# Patient Record
Sex: Female | Born: 1980
Health system: Southern US, Community
[De-identification: ages and names within clinical notes are randomized; demographics above are authoritative.]

## PROBLEM LIST (undated history)

## (undated) HISTORY — PX: APPENDECTOMY: SHX54

## (undated) HISTORY — PX: TUBAL LIGATION: SHX77

## (undated) SURGERY — Surgical Case
Anesthesia: *Unknown

---

## 2005-03-20 ENCOUNTER — Ambulatory Visit: Payer: Self-pay | Admitting: *Deleted

## 2005-04-03 ENCOUNTER — Ambulatory Visit (HOSPITAL_COMMUNITY): Admission: RE | Admit: 2005-04-03 | Discharge: 2005-04-03 | Payer: Self-pay | Admitting: *Deleted

## 2005-04-03 ENCOUNTER — Ambulatory Visit: Payer: Self-pay | Admitting: *Deleted

## 2005-04-10 ENCOUNTER — Ambulatory Visit: Payer: Self-pay | Admitting: *Deleted

## 2005-04-17 ENCOUNTER — Ambulatory Visit: Payer: Self-pay | Admitting: *Deleted

## 2005-04-24 ENCOUNTER — Ambulatory Visit: Payer: Self-pay | Admitting: *Deleted

## 2005-05-01 ENCOUNTER — Ambulatory Visit: Payer: Self-pay | Admitting: *Deleted

## 2005-05-08 ENCOUNTER — Ambulatory Visit: Payer: Self-pay | Admitting: *Deleted

## 2005-05-09 ENCOUNTER — Ambulatory Visit: Payer: Self-pay | Admitting: Obstetrics and Gynecology

## 2005-05-09 ENCOUNTER — Inpatient Hospital Stay (HOSPITAL_COMMUNITY): Admission: AD | Admit: 2005-05-09 | Discharge: 2005-05-11 | Payer: Self-pay | Admitting: *Deleted

## 2006-09-16 ENCOUNTER — Emergency Department (HOSPITAL_COMMUNITY): Admission: EM | Admit: 2006-09-16 | Discharge: 2006-09-17 | Payer: Self-pay | Admitting: Emergency Medicine

## 2008-12-16 ENCOUNTER — Ambulatory Visit (HOSPITAL_COMMUNITY): Admission: RE | Admit: 2008-12-16 | Discharge: 2008-12-16 | Payer: Self-pay | Admitting: Family Medicine

## 2008-12-29 ENCOUNTER — Inpatient Hospital Stay (HOSPITAL_COMMUNITY): Admission: AD | Admit: 2008-12-29 | Discharge: 2008-12-29 | Payer: Self-pay | Admitting: Obstetrics & Gynecology

## 2008-12-29 ENCOUNTER — Inpatient Hospital Stay (HOSPITAL_COMMUNITY): Admission: AD | Admit: 2008-12-29 | Discharge: 2008-12-29 | Payer: Self-pay | Admitting: Family Medicine

## 2009-01-14 ENCOUNTER — Ambulatory Visit (HOSPITAL_COMMUNITY): Admission: RE | Admit: 2009-01-14 | Discharge: 2009-01-14 | Payer: Self-pay | Admitting: Obstetrics & Gynecology

## 2009-04-15 ENCOUNTER — Ambulatory Visit: Payer: Self-pay | Admitting: Obstetrics and Gynecology

## 2009-04-15 ENCOUNTER — Inpatient Hospital Stay (HOSPITAL_COMMUNITY): Admission: AD | Admit: 2009-04-15 | Discharge: 2009-04-15 | Payer: Self-pay | Admitting: Obstetrics & Gynecology

## 2009-05-03 ENCOUNTER — Inpatient Hospital Stay (HOSPITAL_COMMUNITY): Admission: AD | Admit: 2009-05-03 | Discharge: 2009-05-03 | Payer: Self-pay | Admitting: Obstetrics & Gynecology

## 2009-05-03 ENCOUNTER — Ambulatory Visit: Payer: Self-pay | Admitting: Advanced Practice Midwife

## 2009-05-23 ENCOUNTER — Inpatient Hospital Stay (HOSPITAL_COMMUNITY): Admission: AD | Admit: 2009-05-23 | Discharge: 2009-05-23 | Payer: Self-pay | Admitting: Obstetrics & Gynecology

## 2009-05-27 ENCOUNTER — Ambulatory Visit: Payer: Self-pay | Admitting: Family Medicine

## 2009-05-27 ENCOUNTER — Inpatient Hospital Stay (HOSPITAL_COMMUNITY): Admission: AD | Admit: 2009-05-27 | Discharge: 2009-05-29 | Payer: Self-pay | Admitting: Obstetrics & Gynecology

## 2009-07-21 ENCOUNTER — Emergency Department (HOSPITAL_COMMUNITY): Admission: EM | Admit: 2009-07-21 | Discharge: 2009-07-22 | Payer: Self-pay | Admitting: Emergency Medicine

## 2011-03-10 LAB — GC/CHLAMYDIA PROBE AMP, GENITAL
Chlamydia, DNA Probe: NEGATIVE
GC Probe Amp, Genital: NEGATIVE

## 2011-03-10 LAB — URINALYSIS, ROUTINE W REFLEX MICROSCOPIC
Glucose, UA: NEGATIVE mg/dL
Ketones, ur: NEGATIVE mg/dL
pH: 6.5 (ref 5.0–8.0)

## 2011-03-10 LAB — DIFFERENTIAL
Basophils Absolute: 0 10*3/uL (ref 0.0–0.1)
Eosinophils Absolute: 0.1 10*3/uL (ref 0.0–0.7)
Lymphocytes Relative: 13 % (ref 12–46)
Neutro Abs: 8.3 10*3/uL — ABNORMAL HIGH (ref 1.7–7.7)
Neutrophils Relative %: 78 % — ABNORMAL HIGH (ref 43–77)

## 2011-03-10 LAB — BASIC METABOLIC PANEL
BUN: 8 mg/dL (ref 6–23)
CO2: 23 mEq/L (ref 19–32)
Calcium: 9 mg/dL (ref 8.4–10.5)
Creatinine, Ser: 0.71 mg/dL (ref 0.4–1.2)
Glucose, Bld: 107 mg/dL — ABNORMAL HIGH (ref 70–99)

## 2011-03-10 LAB — CBC
MCHC: 33.5 g/dL (ref 30.0–36.0)
Platelets: 210 10*3/uL (ref 150–400)
RDW: 27.8 % — ABNORMAL HIGH (ref 11.5–15.5)

## 2011-03-10 LAB — URINE MICROSCOPIC-ADD ON

## 2011-03-10 LAB — URINE CULTURE

## 2011-03-10 LAB — WET PREP, GENITAL
Trich, Wet Prep: NONE SEEN
Yeast Wet Prep HPF POC: NONE SEEN

## 2011-03-12 LAB — URINALYSIS, ROUTINE W REFLEX MICROSCOPIC
Bilirubin Urine: NEGATIVE
Ketones, ur: NEGATIVE mg/dL
Nitrite: NEGATIVE
Urobilinogen, UA: 0.2 mg/dL (ref 0.0–1.0)

## 2011-03-12 LAB — CBC
HCT: 29.6 % — ABNORMAL LOW (ref 36.0–46.0)
HCT: 28.8 % — ABNORMAL LOW (ref 36.0–46.0)
Hemoglobin: 10.1 g/dL — ABNORMAL LOW (ref 12.0–15.0)
Hemoglobin: 9.8 g/dL — ABNORMAL LOW (ref 12.0–15.0)
MCHC: 32.8 g/dL (ref 30.0–36.0)
MCHC: 33.1 g/dL (ref 30.0–36.0)
MCHC: 34 g/dL (ref 30.0–36.0)
MCV: 73.3 fL — ABNORMAL LOW (ref 78.0–100.0)
MCV: 73.4 fL — ABNORMAL LOW (ref 78.0–100.0)
MCV: 77.2 fL — ABNORMAL LOW (ref 78.0–100.0)
Platelets: 198 10*3/uL (ref 150–400)
RBC: 4.17 MIL/uL (ref 3.87–5.11)
RBC: 3.73 MIL/uL — ABNORMAL LOW (ref 3.87–5.11)
RDW: 16.8 % — ABNORMAL HIGH (ref 11.5–15.5)
RDW: 15.8 % — ABNORMAL HIGH (ref 11.5–15.5)

## 2011-03-12 LAB — COMPREHENSIVE METABOLIC PANEL
ALT: 12 U/L (ref 0–35)
BUN: 3 mg/dL — ABNORMAL LOW (ref 6–23)
CO2: 23 mEq/L (ref 19–32)
Calcium: 8.4 mg/dL (ref 8.4–10.5)
GFR calc non Af Amer: >60 mL/min (ref >60)
Glucose, Bld: 88 mg/dL (ref 70–99)
Sodium: 137 mEq/L (ref 135–145)

## 2011-03-12 LAB — RPR: RPR Ser Ql: NONREACTIVE

## 2011-03-13 LAB — FETAL FIBRONECTIN: Fetal Fibronectin: NEGATIVE

## 2011-03-19 LAB — URINALYSIS, ROUTINE W REFLEX MICROSCOPIC
Bilirubin Urine: NEGATIVE
Bilirubin Urine: NEGATIVE
Hgb urine dipstick: NEGATIVE
Ketones, ur: NEGATIVE mg/dL
Ketones, ur: NEGATIVE mg/dL
Nitrite: NEGATIVE
Protein, ur: NEGATIVE mg/dL
Specific Gravity, Urine: 1.01 (ref 1.005–1.030)
Urobilinogen, UA: 0.2 mg/dL (ref 0.0–1.0)
Urobilinogen, UA: 0.2 mg/dL (ref 0.0–1.0)

## 2011-03-19 LAB — WET PREP, GENITAL

## 2011-03-19 LAB — CBC
HCT: 32.2 % — ABNORMAL LOW (ref 36.0–46.0)
HCT: 36.7 % (ref 36.0–46.0)
Hemoglobin: 11 g/dL — ABNORMAL LOW (ref 12.0–15.0)
Hemoglobin: 12.6 g/dL (ref 12.0–15.0)
MCHC: 34.4 g/dL (ref 30.0–36.0)
RDW: 12.9 % (ref 11.5–15.5)
WBC: 11.3 10*3/uL — ABNORMAL HIGH (ref 4.0–10.5)

## 2011-03-19 LAB — URINE CULTURE

## 2011-03-19 LAB — GC/CHLAMYDIA PROBE AMP, GENITAL: Chlamydia, DNA Probe: NEGATIVE

## 2011-04-17 NOTE — Op Note (Signed)
NAMEJARVIS, KNODEL                  ACCOUNT NO.:  [id number]   MEDICAL RECORD NO.:  [id number]          PATIENT TYPE:  INP   LOCATION:  9112                          FACILITY:  [facility]   PHYSICIAN:  Lesly Dukes, M.D. DATE OF BIRTH:  Aug 09, 1981   DATE OF PROCEDURE:  05/27/2009  DATE OF DISCHARGE:                               OPERATIVE REPORT   PREOPERATIVE DIAGNOSES:  1. Multiparity.  2. Undesired fertility.   POSTOPERATIVE DIAGNOSES:  1. Multiparity.  2. Undesired fertility.   PROCEDURE:  Bilateral tubal sterilization with Filshie clips.   ANESTHESIA:  Epidural and local.   INDICATIONS FOR PROCEDURE:  [name] is a 30 year old, now gravida  4, para 3-0-1-3, who is status post normal spontaneous vaginal delivery  early this morning.  She has previously expressed a desire to have  bilateral tubal sterilization.  She has been counseled on the risks and  benefits of this procedure to include but not limited to bleeding,  infection, damage to her internal organs.  Additionally, she was  counseled on the permanency of this procedure as well as the failure  rate being approximately 1 in 200 with an increased risk of ectopic  pregnancy should a failure occur.  The patient voiced understanding of  all the above and desired to proceed with the procedure.   DESCRIPTION OF PROCEDURE:  The patient was taken to the operating room  where her epidural anesthesia was re-bolused.  Appropriate anesthesia  was confirmed.  The patient was then prepped and draped in the usual  sterile manner.  A time-out was conducted.  A 3-cm transverse incision  was made in the skin and inferior aspect umbilicus.  It was extended  through the subcutaneous layers.  The fascia was then incised in the  midline with Metzenbaum scissors.  The fascial incision was extended  laterally using the Metzenbaum scissors.  Peritoneum was entered  bluntly.  The left fallopian tube was identified and grasped with  Babcock clamp.  It was traced distally to identification of the  fimbriated end.  A Filshie clip was placed approximately 2 cm in the  cornu of the uterus.  The right fallopian tube was then identified and  grasped with a Babcock clamp.  It was traced distally to identification  of the fimbriated end.  A Filshie clip was then placed approximately 2  cm from the cornu of the uterus.  The tubes were then returned to the  abdomen.  The fascia was then closed using 0 Vicryl in a running  noninterlocking manner.  The skin was then closed using 4-0 Vicryl in a  subcuticular stitch.  All sponge, instrument, needle counts were correct  x2.  The skin was then anesthetized with 6 mL of 0.25% Marcaine without  epinephrine.   FINDINGS:  Bilateral fallopian tubes grossly normal.   No specimens.   Estimated blood loss is minimal.   There are no immediate complications.   The patient taken to the PACU in good condition.      Odie Sera, DO  Electronically Signed  ______________________________  Lesly Dukes, M.D.    MC/MEDQ  D:  05/27/2009  T:  05/28/2009  Job:  [id number]

## 2013-05-23 ENCOUNTER — Ambulatory Visit: Payer: Self-pay | Admitting: Internal Medicine

## 2013-05-23 ENCOUNTER — Encounter: Payer: Self-pay | Admitting: Internal Medicine

## 2013-05-23 ENCOUNTER — Ambulatory Visit: Payer: Self-pay

## 2013-05-23 VITALS — BP 150/102 | HR 109 | Temp 99.8°F | Resp 28

## 2013-05-23 DIAGNOSIS — R079 Chest pain, unspecified: Secondary | ICD-10-CM

## 2013-05-23 LAB — POCT CBC
Granulocyte percent: 65.2 %G (ref 37–80)
HCT, POC: 41.5 % (ref 37.7–47.9)
Hemoglobin: 13.1 g/dL (ref 12.2–16.2)
MCV: 90.6 fL (ref 80–97)
MID (cbc): 0.5 (ref 0–0.9)
Platelet Count, POC: 282 10*3/uL (ref 142–424)
RBC: 4.58 M/uL (ref 4.04–5.48)
WBC: 9 10*3/uL (ref 4.6–10.2)

## 2013-05-23 MED ORDER — CYCLOBENZAPRINE HCL 10 MG PO TABS: 10.0000 mg | ORAL_TABLET | Freq: Every day | ORAL | Status: DC

## 2013-05-23 MED ORDER — MELOXICAM 15 MG PO TABS: 15.0000 mg | ORAL_TABLET | Freq: Every day | ORAL | Status: DC

## 2013-05-23 NOTE — Patient Instructions (Addendum)
Recheck Tuesday between 4 and 6:30pm

## 2013-05-23 NOTE — Progress Notes (Signed)
Subjective:    Patient ID: Tara Hartman, female    DOB: 06-Jul-1981, 32 y.o.   MRN: [id number]  HPI brought in as an emergency due to chest pain and shortness of breath Obviously frightened Hyperventilating/clutching chest  Complaining of pain in the epigastric area reddened and to the midline of the chest getting worse with movement or deep breathing//feels like she can't get air This is been present for 2-3 days/it prevented her from sleeping last night No prior cardiovascular problems Hasn't felt like eating 2 days but no vomiting  5 preg/4 children-1 mc pmh-childhood arthritis? In [place]  Review of Systems No fever chills or night sweats No recent weight loss No vision problems No chest injuries No history of asthma or wheezing No history of palpitations dizziness or syncope Complains of dyspepsia for 6 months/has always had to eat a bland diet Long history of constipation with bowel movements only once or twice a week-she expresses some aversion to having bowel movements Genitourinary problems-UTIs with one pregnancy//UTI one year ago No recent bone or joint problems Has a history of headaches frequently but these do not interfere with activity Recent increased stress due to starting a part-time job and then having all 4 kids home with school being out    Objective:   Physical Exam BP 150/102  Pulse 109  Temp(Src) 99.8 F (37.7 C) (Oral)  Resp 28  SpO2 99% In great distress and hyperventilating Pupils equal round reactive to light and accommodation/EOMs conjugate ENT clear without nodes or thyromegaly Pulse ox 100%/EKG done emergently  is normal lungs clear to auscultation/ heart regular without murmur rub click or gallop -right 100 Tender sternum and CS junctions on L to xiphoid Tender L ant chest wall in general without swelling or ecchymoses Tender over Tspine midline t-3-7 tho twist torso not painful and neck has ful rom w/out pain abd-soft/mildly tender LUQ but no  splenomeg or masses//no ruq tend or hepatomeg/mild epigastr tenderness (GI cocktail produced no relief of sxt over 30 min) Spine straight/joints are clear No skin rashes present Cranial nerves II through XII intact  Reexam after x-ray and all testing completed confirms above 119/79 BP at end of visit/rate 70  Results for orders placed in visit on 05/23/13  POCT CBC      Result Value Range   WBC 9.0  4.6 - 10.2 K/uL   Lymph, poc 2.6  0.6 - 3.4   POC LYMPH PERCENT 28.7  10 - 50 %L   MID (cbc) 0.5  0 - 0.9   POC MID % 6.1  0 - 12 %M   POC Granulocyte 5.9  2 - 6.9   Granulocyte percent 65.2  37 - 80 %G   RBC 4.58  4.04 - 5.48 M/uL   Hemoglobin 13.1  12.2 - 16.2 g/dL   HCT, POC 40.9  81.1 - 47.9 %   MCV 90.6  80 - 97 fL   MCH, POC 28.6  27 - 31.2 pg   MCHC 31.6 (*) 31.8 - 35.4 g/dL   RDW, POC 91.4     Platelet Count, POC 282  142 - 424 K/uL   MPV 11.2  0 - 99.8 fL   EKG=WNL   [facility] reading (PRIMARY) by  Dr. Ephram Kornegay=NAD   Assessment & Plan:  Problem #1 chest pain with shortness of breath-this seems related to chest wall tenderness or some radiation from the left upper quadrant of the abdomen without any acute abdominal findings Problem #2 history of dyspepsia  Problem #3 history of constipation  Meds ordered this encounter  Medications  . meloxicam (MOBIC) 15 MG tablet    Sig: Take 1 tablet (15 mg total) by mouth daily. For chest pain    Dispense:  30 tablet    Refill:  0  . cyclobenzaprine (FLEXERIL) 10 MG tablet    Sig: Take 1 tablet (10 mg total) by mouth at bedtime.    Dispense:  15 tablet    Refill:  0   Followup in 3 days/has labs pending

## 2013-05-24 LAB — COMPREHENSIVE METABOLIC PANEL
ALT: 16 U/L (ref 0–35)
AST: 14 U/L (ref 0–37)
Albumin: 4.2 g/dL (ref 3.5–5.2)
BUN: 9 mg/dL (ref 6–23)
CO2: 25 mEq/L (ref 19–32)
Calcium: 9.7 mg/dL (ref 8.4–10.5)
Chloride: 105 mEq/L (ref 96–112)
Potassium: 3.7 mEq/L (ref 3.5–5.3)

## 2013-05-25 ENCOUNTER — Encounter: Payer: Self-pay | Admitting: Internal Medicine

## 2013-05-25 LAB — H. PYLORI ANTIBODY, IGG: H Pylori IgG: 0.57 {ISR}

## 2013-05-31 ENCOUNTER — Encounter: Payer: Self-pay | Admitting: Internal Medicine

## 2014-03-11 ENCOUNTER — Emergency Department (HOSPITAL_COMMUNITY): Payer: No Typology Code available for payment source

## 2014-03-11 ENCOUNTER — Encounter (HOSPITAL_COMMUNITY): Payer: Self-pay | Admitting: Emergency Medicine

## 2014-03-11 ENCOUNTER — Emergency Department (HOSPITAL_COMMUNITY)
Admission: EM | Admit: 2014-03-11 | Discharge: 2014-03-12 | Disposition: A | Discharge status: Home / Self Care | Payer: No Typology Code available for payment source | Attending: Emergency Medicine | Admitting: Emergency Medicine

## 2014-03-11 DIAGNOSIS — G43909 Migraine, unspecified, not intractable, without status migrainosus: Secondary | ICD-10-CM

## 2014-03-11 DIAGNOSIS — H53149 Visual discomfort, unspecified: Secondary | ICD-10-CM | POA: Insufficient documentation

## 2014-03-11 DIAGNOSIS — R55 Syncope and collapse: Secondary | ICD-10-CM

## 2014-03-11 DIAGNOSIS — R110 Nausea: Secondary | ICD-10-CM | POA: Insufficient documentation

## 2014-03-11 DIAGNOSIS — Z3202 Encounter for pregnancy test, result negative: Secondary | ICD-10-CM | POA: Insufficient documentation

## 2014-03-11 DIAGNOSIS — Z79899 Other long term (current) drug therapy: Secondary | ICD-10-CM | POA: Insufficient documentation

## 2014-03-11 LAB — CBG MONITORING, ED: GLUCOSE-CAPILLARY: 94 mg/dL (ref 70–99)

## 2014-03-11 NOTE — ED Notes (Signed)
Pt states she has not felt good all day but went to work for a meeting  Pt states she has had a headache today  Pt states about 3 hrs ago she passed out  Pt states since then she has had a headache and felt dizzy

## 2014-03-12 LAB — CSF CELL COUNT WITH DIFFERENTIAL
RBC COUNT CSF: 283 /mm3 — AB
RBC Count, CSF: 2 /mm3 — ABNORMAL HIGH
TUBE #: 4
Tube #: 1
WBC CSF: 3 /mm3 (ref 0–5)
WBC, CSF: 1 /mm3 (ref 0–5)

## 2014-03-12 LAB — URINALYSIS, ROUTINE W REFLEX MICROSCOPIC
Bilirubin Urine: NEGATIVE
Glucose, UA: NEGATIVE mg/dL
HGB URINE DIPSTICK: NEGATIVE
KETONES UR: NEGATIVE mg/dL
Nitrite: NEGATIVE
PROTEIN: NEGATIVE mg/dL
Specific Gravity, Urine: 1.012 (ref 1.005–1.030)
UROBILINOGEN UA: 0.2 mg/dL (ref 0.0–1.0)
pH: 5.5 (ref 5.0–8.0)

## 2014-03-12 LAB — PROTEIN, CSF: Total  Protein, CSF: 21 mg/dL (ref 15–45)

## 2014-03-12 LAB — GLUCOSE, CSF: GLUCOSE CSF: 59 mg/dL (ref 43–76)

## 2014-03-12 LAB — URINE MICROSCOPIC-ADD ON

## 2014-03-12 LAB — GRAM STAIN: Special Requests: NORMAL

## 2014-03-12 LAB — I-STAT CHEM 8, ED
BUN: 16 mg/dL (ref 6–23)
CALCIUM ION: 1.11 mmol/L — AB (ref 1.12–1.23)
Chloride: 104 mEq/L (ref 96–112)
Creatinine, Ser: 0.8 mg/dL (ref 0.50–1.10)
Glucose, Bld: 91 mg/dL (ref 70–99)
HCT: 38 % (ref 36.0–46.0)
HEMOGLOBIN: 12.9 g/dL (ref 12.0–15.0)
Potassium: 3.9 mEq/L (ref 3.7–5.3)
SODIUM: 142 meq/L (ref 137–147)
TCO2: 24 mmol/L (ref 0–100)

## 2014-03-12 LAB — POC URINE PREG, ED: Preg Test, Ur: NEGATIVE

## 2014-03-12 MED ORDER — DEXAMETHASONE SODIUM PHOSPHATE 10 MG/ML IJ SOLN
10.0000 mg | Freq: Once | INTRAMUSCULAR | Status: AC
  Administered 2014-03-12: 10 mg via INTRAVENOUS
  Filled 2014-03-12: qty 1

## 2014-03-12 MED ORDER — DIPHENHYDRAMINE HCL 50 MG/ML IJ SOLN
25.0000 mg | Freq: Once | INTRAMUSCULAR | Status: AC
  Administered 2014-03-12: 25 mg via INTRAVENOUS
  Filled 2014-03-12: qty 1

## 2014-03-12 MED ORDER — METOCLOPRAMIDE HCL 5 MG/ML IJ SOLN
10.0000 mg | Freq: Once | INTRAMUSCULAR | Status: AC
  Administered 2014-03-12: 10 mg via INTRAVENOUS
  Filled 2014-03-12: qty 2

## 2014-03-12 MED ORDER — KETOROLAC TROMETHAMINE 30 MG/ML IJ SOLN
30.0000 mg | Freq: Once | INTRAMUSCULAR | Status: AC
  Administered 2014-03-12: 30 mg via INTRAVENOUS
  Filled 2014-03-12: qty 1

## 2014-03-12 MED ORDER — HYDROCODONE-ACETAMINOPHEN 5-325 MG PO TABS: 1.0000 | ORAL_TABLET | Freq: Four times a day (QID) | ORAL | Status: DC | PRN

## 2014-03-12 MED ORDER — HYDROMORPHONE HCL PF 1 MG/ML IJ SOLN
1.0000 mg | Freq: Once | INTRAMUSCULAR | Status: AC
  Administered 2014-03-12: 1 mg via INTRAVENOUS
  Filled 2014-03-12: qty 1

## 2014-03-12 MED ORDER — IBUPROFEN 600 MG PO TABS: 600.0000 mg | ORAL_TABLET | Freq: Four times a day (QID) | ORAL | Status: DC | PRN

## 2014-03-12 NOTE — Discharge Instructions (Signed)
We saw you in the ER for headaches and loss of consciousness. All the labs and imaging are normal. We are not sure what is causing your symptoms, however, there appears to be no evidence of infection, bleeds or tumors based on our exam and results.  Please take motrin round the clock for the next 6 hours, and take other meds prescribed only for break through pain. See your doctor if the pain persists, as you might need better medications or a specialist.  Cefalea migraosa (Migraine Headache) Una cefalea migraosa es un dolor intenso y punzante en uno o ambos lados de la cabeza. La migraa puede durar desde 30 minutos hasta varias horas. CAUSAS  No siempre se conoce la causa exacta de la cefalea migraosa. Sin embargo, IT consultantpueden aparecer Circuit Citycuando los nervios del cerebro se irritan y liberan ciertas sustancias qumicas que causan inflamacin. Esto ocasiona dolor. Existen tambin ciertos factores que pueden desencadenar las migraas, como los siguientes:  Alcohol.  Fumar.  Estrs.  La menstruacin  Quesos aejados.  Los alimentos o las bebidas que contienen nitratos, glutamato, aspartamo o tiramina.  Falta de sueo.  Chocolate.  Cafena.  Hambre.  Actividad fsica extenuante.  Fatiga.  Medicamentos que se usan para tratar Aeronautical engineerel dolor en el pecho (nitroglicerina), pldoras anticonceptivas, estrgeno y algunos medicamentos para la hipertensin arterial. SIGNOS Y SNTOMAS  Dolor en uno o ambos lados de la cabeza.  Dolor pulsante o punzante.  Dolor intenso que impide Ameren Corporationrealizar las actividades diarias.  Dolor que se agrava por cualquier actividad fsica.  Nuseas, vmitos o ambos.  Mareos.  Dolor con la exposicin a las luces brillantes, a los ruidos fuertes o la Davisonactividad.  Sensibilidad general a las luces brillantes, a los ruidos fuertes o a los Limited Brandsolores. Antes de sufrir una migraa, puede recibir seales de advertencia (aura). Un aura puede incluir:  Ver las luces  intermitentes.  Ver puntos brillantes, halos o lneas en zigzag.  Tener una visin en tnel o visin borrosa.  Sensacin de entumecimiento u hormigueo.  Dificultad para hablar  Debilidad muscular. DIAGNSTICO  La cefalea migraosa se diagnostica en funcin de lo siguiente:  Sntomas.  Examen fsico.  Neomia DearUna TC (tomografa computada) o resonancia magntica de la cabeza. Estas pruebas de diagnstico por imagen no pueden diagnosticar las migraas, pero pueden ayudar a Sales promotion account executivedescartar otras causas de las cefaleas. TRATAMIENTO Le prescribirn medicamentos para Engineer, materialsaliviar el dolor y las nuseas. Tambin podrn administrarse medicamentos para ayudar a Armed forces training and education officerprevenir las migraas recurrentes.  INSTRUCCIONES PARA EL CUIDADO EN EL HOGAR  Slo tome medicamentos de venta libre o recetados para Primary school teachercalmar el dolor o Environmental health practitionerel malestar, segn las indicaciones de su mdico. No se recomienda usar los opiceos a Air cabin crewlargo plazo.  Cuando tenga la migraa, acustese en un cuarto oscuro y tranquilo  Lleve un registro diario para Financial risk analystaveriguar lo que puede provocar las Soil scientistcefaleas migraosas. Por ejemplo, escriba:  Lo que usted come y bebe.  Cunto tiempo duerme.  Algn cambio en su dieta o en los medicamentos.  Limite el consumo de bebidas alcohlicas.  Si fuma, deje de hacerlo.  Duerma entre 7 y 9horas, o segn las recomendaciones del mdico.  Limite el estrs.  Mantenga las luces tenues si le Goodrich Corporationmolestan las luces brillantes y la Powellmigraa empeora. SOLICITE ATENCIN MDICA DE INMEDIATO SI:   La migraa se hace cada vez ms intensa.  Tiene fiebre.  Presenta rigidez en el cuello.  Tiene prdida de visin.  Presenta debilidad muscular o prdida del control muscular.  Comienza a perder  el equilibrio o tiene problemas para Advertising account planner.  Sufre mareos o se desmaya.  Tiene sntomas graves que son diferentes a los primeros sntomas. ASEGRESE DE QUE:   Comprende estas instrucciones.  Controlar su afeccin.  Recibir ayuda  de inmediato si no mejora o si empeora. Document Released: 11/19/2005 Document Revised: 09/09/2013 [facility] Patient Information 2014 [place], [place]. Sncope  (Syncope)  El sncope es un episodio de [place]. La persona pierde la conciencia y cae al piso. Generalmente permanece inconsciente durante menos de 5 minutos. Puede tener algunas contracciones musculares durante un mximo de 15 segundos antes de despertar y Programme researcher, broadcasting/film/video a la normalidad. El sncope se presenta con mayor frecuencia en personas de edad avanzada, pero puede ocurrir a Actuary. Aunque la Harley[employer]son de las causas no implican un peligro, el sncope puede ser un signo de un problema mdico grave. Es importante buscar atencin mdica.  CAUSAS  La causa del sncope es una disminucin sbita del flujo de sangre al cerebro. Generalmente la causa especfica no puede determinarse. Los factores que pueden desencadenar el sncope son:   El uso de medicamentos que disminuyen la presin arterial.  Los cambios sbitos de [place], como por ejemplo al ponerse de pie.  Tomar ms dosis de medicamento que lo recetado.  Permanecer de pie en un lugar por [employer].  Sufrir convulsiones.  Deshidratacin y exposicin excesiva al calor.  Bajo nivel de Banker (hipoglucemia).  Dificultad para mover el intestino.  Enfermedades cardacas, latidos cardacos irregulares u otros problemas circulatorios.  [place], estrs emocional, visin de Chemical engineer intenso. SNTOMAS  Inmediatamente antes de desmayarse podr:   Sentirse mareado o aturdido.  Sentir nuseas.  Ver todo blanco o negro en el campo de la visin.  Tiene la piel fra y pegajosa. DIAGNSTICO  El mdico le preguntar acerca de sus sntomas, realizar un examen fsico y un electrocardiograma (ECG) para registrar la actividad elctrica del corazn. Tambin podr indicarle otras pruebas cardacas o anlisis de sangre para determinar las causas.  TRATAMIENTO  En la  [employer], no se necesita tratamiento. Segn la causa del sncope, el mdico podr recomendarle que cambie o deje de tomar algunos de sus medicamentos.  INSTRUCCIONES PARA EL CUIDADO EN EL HOGAR   Pdale a alguien que se quede con usted hasta que se sienta estable.  No conduzca vehculos, no use maquinarias ni practique deportes hasta que el mdico lo autorice.  Cumpla con todas las visitas de control, segn le indique su mdico.  Recustese inmediatamente si siente que va a desmayarse. Respire profundamente y de [place] continua. Espere hasta que los sntomas hayan desaparecido.  Debe ingerir gran cantidad de lquido para mantener la orina de tono claro o color amarillo plido.  Si est tomando medicamentos para la presin arterial o para el corazn, pngase de pie lentamente, tmese algunos minutos para permanecer sentado y luego prese. Esto reduce los mareos. SOLICITE ATENCIN MDICA DE INMEDIATO SI:   Sufre un dolor intenso de [place].  Siente un dolor intenso inusual en el pecho, el abdomen, o la espalda.  Tiene un sangrado por la boca o el recto, o la materia fecal es de color negro o aspecto alquitranado.  Siente latidos irregulares o muy rpidos.  Siente dolor al respirar.  Sufre episodios de Baxter International repetidos o temblores como sacudidas durante un episodio.  Se desmaya mientras se encuentra sentado o acostado.  Se siente repentinamente confuso.  Tiene problemas para caminar.  Siente debilidad intensa.  Tiene problemas  de visin. Si se desmaya, llame al servicio de emergencia de su localidad (911 en los [place]). No conduzca por sus propios medios [employer] hospital.  ASEGRESE DE QUE:   Comprende estas instrucciones.  Controlar su enfermedad.  Solicitar ayuda de inmediato si no mejora o empeora. Document Released: 08/29/2005 Document Revised: 08/13/2012 [facility] Patient Information 2014 [place], [place].

## 2014-03-12 NOTE — ED Notes (Signed)
POCT U-PREG resulted Neg.

## 2014-03-12 NOTE — ED Provider Notes (Signed)
CSN: [id number]     Arrival date & time 03/11/14  2211 History   First MD Initiated Contact with Patient 03/11/14 2305     Chief Complaint  Patient presents with  . Loss of Consciousness     (Consider location/radiation/quality/duration/timing/severity/associated sxs/prior Treatment) HPI Comments: Pt with hx of migraines comes in with cc of headaches. Hx of migraines. States that her pain is typical of her migraines, but it is the worst she has never had. Associated with the headachs is photophobia, nausea. She also reports an episode of syncope, and is having dizziness. No neck pain, stiffness, trauma, fevers, chills. NO fam hx of brain Aneuyrysm, brain CA that she is aware of. Pt has no CAD, and denies any chest pain, palpitations, dib prior to her passing out.  Patient is a 33 y.o. female presenting with syncope. The history is provided by the patient.  Loss of Consciousness Associated symptoms: headaches   Associated symptoms: no chest pain, no nausea, no shortness of breath and no vomiting     History reviewed. No pertinent past medical history. Past Surgical History  Procedure Laterality Date  . Appendectomy    . Tubal ligation     Family History  Problem Relation Age of Onset  . Diabetes Mother    History  Substance Use Topics  . Smoking status: Never Smoker   . Smokeless tobacco: Not on file  . Alcohol Use: Yes     Comment: occ   OB History   Grav Para Term Preterm Abortions TAB SAB Ect Mult Living                 Review of Systems  Constitutional: Negative for activity change.  Respiratory: Negative for shortness of breath.   Cardiovascular: Positive for syncope. Negative for chest pain.  Gastrointestinal: Negative for nausea, vomiting and abdominal pain.  Genitourinary: Negative for dysuria.  Musculoskeletal: Negative for neck pain.  Neurological: Positive for syncope and headaches.  All other systems reviewed and are negative.     Allergies   Pork-derived products  Home Medications   Current Outpatient Rx  Name  Route  Sig  Dispense  Refill  . albuterol (PROVENTIL HFA;VENTOLIN HFA) 108 (90 BASE) MCG/ACT inhaler   Inhalation   Inhale 1 puff into the lungs every 6 (six) hours as needed for wheezing or shortness of breath.         Marland Kitchen aspirin-acetaminophen-caffeine (EXCEDRIN MIGRAINE) 250-250-65 MG per tablet   Oral   Take 2 tablets by mouth every 6 (six) hours as needed for headache or migraine.         Marland Kitchen HYDROcodone-acetaminophen (NORCO/VICODIN) 5-325 MG per tablet   Oral   Take 1 tablet by mouth every 6 (six) hours as needed for severe pain.   6 tablet   0   . ibuprofen (ADVIL,MOTRIN) 600 MG tablet   Oral   Take 1 tablet (600 mg total) by mouth every 6 (six) hours as needed.   30 tablet   0    BP 121/85  Pulse 81  Temp(Src) 97.6 F (36.4 C) (Oral)  Resp 16  Ht 5' 5.5" (1.664 m)  Wt 150 lb (68.04 kg)  BMI 24.57 kg/m2  SpO2 91%  LMP 02/14/2014 Physical Exam  Nursing note and vitals reviewed. Constitutional: She is oriented to person, place, and time. She appears well-developed and well-nourished.  HENT:  Head: Normocephalic and atraumatic.  Eyes: EOM are normal. Pupils are equal, round, and reactive to light.  Neck:  Neck supple.  Cardiovascular: Normal rate, regular rhythm and normal heart sounds.   No murmur heard. Pulmonary/Chest: Effort normal. No respiratory distress.  Abdominal: Soft. She exhibits no distension. There is no tenderness. There is no rebound and no guarding.  Neurological: She is alert and oriented to person, place, and time. No cranial nerve deficit. Coordination normal.  Cerebellar exam is normal (finger to nose) Sensory exam normal for bilateral upper and lower extremities - and patient is able to discriminate between sharp and dull. Motor exam is 4+/5   Skin: Skin is warm and dry.    ED Course  LUMBAR PUNCTURE Date/Time: 03/12/2014 8:28 AM Performed by: Derwood KaplanNANAVATI,  Lachlan Mckim Authorized by: Derwood KaplanNANAVATI, Tequita Marrs Consent: Verbal consent obtained. Risks and benefits: risks, benefits and alternatives were discussed Consent given by: patient Required items: required blood products, implants, devices, and special equipment available Patient identity confirmed: verbally with patient Time out: Immediately prior to procedure a "time out" was called to verify the correct patient, procedure, equipment, support staff and site/side marked as required. Indications: evaluation for subarachnoid hemorrhage Anesthesia: local infiltration Local anesthetic: lidocaine 1% with epinephrine Anesthetic total: 3 ml Patient sedated: no Lumbar space: L4-L5 interspace Patient's position: right lateral decubitus Needle gauge: 20 Number of attempts: 2 Fluid appearance: clear Tubes of fluid: 4 Total volume: 10 ml Post-procedure: site cleaned Patient tolerance: Patient tolerated the procedure well with no immediate complications.   (including critical care time) Labs Review Labs Reviewed  URINALYSIS, ROUTINE W REFLEX MICROSCOPIC - Abnormal; Notable for the following:    APPearance CLOUDY (*)    Leukocytes, UA MODERATE (*)    All other components within normal limits  CSF CELL COUNT WITH DIFFERENTIAL - Abnormal; Notable for the following:    RBC Count, CSF 283 (*)    All other components within normal limits  CSF CELL COUNT WITH DIFFERENTIAL - Abnormal; Notable for the following:    RBC Count, CSF 2 (*)    All other components within normal limits  URINE MICROSCOPIC-ADD ON - Abnormal; Notable for the following:    Squamous Epithelial / LPF MANY (*)    Bacteria, UA FEW (*)    All other components within normal limits  I-STAT CHEM 8, ED - Abnormal; Notable for the following:    Calcium, Ion 1.11 (*)    All other components within normal limits  GRAM STAIN  CSF CULTURE  GLUCOSE, CSF  PROTEIN, CSF  CBG MONITORING, ED  POC URINE PREG, ED   Imaging Review Ct Head Wo  Contrast  03/12/2014   CLINICAL DATA:  Headache with loss of consciousness  EXAM: CT HEAD WITHOUT CONTRAST  TECHNIQUE: Contiguous axial images were obtained from the base of the skull through the vertex without intravenous contrast.  COMPARISON:  None.  FINDINGS: Skull and Sinuses:No significant abnormality.  Orbits: No acute abnormality.  Brain: No evidence of acute abnormality, such as acute infarction, hemorrhage, hydrocephalus, or mass lesion/mass effect. There is bilateral globus pallidus calcification, premature for age. No additional deep gray nucleus calcification to suggests an inherited or syndromic process.  IMPRESSION: 1. No acute intracranial disease. 2. Premature globus pallidus calcification, usually from dysregulated calcium metabolism or remote toxic/ infectious insult.   Electronically Signed   By: Tiburcio PeaJonathan  Watts M.D.   On: 03/12/2014 01:01     EKG Interpretation   Date/Time:  Thursday March 11 2014 22:55:32 EDT Ventricular Rate:  67 PR Interval:  158 QRS Duration: 83 QT Interval:  410 QTC Calculation: 433 R  Axis:   71 Text Interpretation:  Sinus rhythm Baseline wander in lead(s) V6 Confirmed  by Rhunette Croft, MD, Denny Mccree [phone]) on 03/12/2014 8:27:22 AM Also confirmed by  Rhunette Croft, MD, Janey Genta [phone])  on 03/12/2014 8:27:34 AM      MDM   Final diagnoses:  Migraine headache  Syncope     DDX includes: Primary headaches - including migrainous headaches, cluster headaches, tension headaches. ICH Carotid dissection Cavernous sinus thrombosis Meningitis Encephalitis Sinusitis Tumor Vascular headaches AV malformation Brain aneurysm Muscular headaches  A/P: Pt comes in with cc of headaches. Described headaches as the worst headaches of her life, and with them she had nausea, dizziness, syncope. Hx of migraines, but never this bad. LP was done, and the results, though this is a slightly traumatic tap, are nt indicative of SAH.  Pt's headache overtime got better.  D/c  with return precautions and neuro f/u. SF syncope score is 0, EKG is normal, no cardiac prodrome.   Derwood Kaplan, MD 03/12/14 [phone]

## 2014-03-15 LAB — CSF CULTURE
CULTURE: NO GROWTH
GRAM STAIN: NONE SEEN

## 2014-03-15 LAB — CSF CULTURE W GRAM STAIN: Special Requests: NORMAL

## 2015-09-04 ENCOUNTER — Emergency Department (INDEPENDENT_AMBULATORY_CARE_PROVIDER_SITE_OTHER)
Admission: EM | Admit: 2015-09-04 | Discharge: 2015-09-04 | Disposition: A | Discharge status: Home / Self Care | Payer: 59 | Source: Home / Self Care | Attending: Emergency Medicine | Admitting: Emergency Medicine

## 2015-09-04 ENCOUNTER — Encounter (HOSPITAL_COMMUNITY): Payer: Self-pay | Admitting: Emergency Medicine

## 2015-09-04 DIAGNOSIS — J40 Bronchitis, not specified as acute or chronic: Secondary | ICD-10-CM

## 2015-09-04 MED ORDER — AZITHROMYCIN 250 MG PO TABS: 250.0000 mg | ORAL_TABLET | Freq: Every day | ORAL | Status: DC

## 2015-09-04 MED ORDER — ALBUTEROL SULFATE HFA 108 (90 BASE) MCG/ACT IN AERS: 1.0000 | INHALATION_SPRAY | Freq: Four times a day (QID) | RESPIRATORY_TRACT | Status: DC | PRN

## 2015-09-04 NOTE — ED Notes (Signed)
Pt here with c/o laryngitis followed with chest pain radiating to back x 3 dys States, it started with cough then progressed greenish phlegm and pain  Stabbing pain with breathing, 100% r/a, no fever,chills,n,v

## 2015-09-04 NOTE — ED Provider Notes (Signed)
CSN: [id number]     Arrival date & time 09/04/15  1928 History   First MD Initiated Contact with Patient 09/04/15 2006     Chief Complaint  Patient presents with  . Cough  . Shortness of Breath   (Consider location/radiation/quality/duration/timing/severity/associated sxs/prior Treatment) Patient is a 34 y.o. female presenting with cough and shortness of breath. The history is provided by the patient. No language interpreter was used.  Cough Cough characteristics:  Productive Sputum characteristics:  Nondescript Severity:  Moderate Onset quality:  Gradual Duration:  3 days Timing:  Constant Progression:  Worsening Chronicity:  New Smoker: no   Context: sick contacts   Context: not smoke exposure   Relieved by:  Nothing Worsened by:  Nothing tried Ineffective treatments:  None tried Associated symptoms: shortness of breath   Associated symptoms: no sore throat   Shortness of Breath Associated symptoms: cough   Associated symptoms: no sore throat     History reviewed. No pertinent past medical history. Past Surgical History  Procedure Laterality Date  . Appendectomy    . Tubal ligation     Family History  Problem Relation Age of Onset  . Diabetes Mother    Social History  Substance Use Topics  . Smoking status: Never Smoker   . Smokeless tobacco: None  . Alcohol Use: Yes     Comment: occ   OB History    No data available     Review of Systems  HENT: Negative for sore throat.   Respiratory: Positive for cough and shortness of breath.   All other systems reviewed and are negative.   Allergies  Pork-derived products  Home Medications   Prior to Admission medications   Medication Sig Start Date End Date Taking? Authorizing Provider  albuterol (PROVENTIL HFA;VENTOLIN HFA) 108 (90 BASE) MCG/ACT inhaler Inhale 1 puff into the lungs every 6 (six) hours as needed for wheezing or shortness of breath.    Historical Provider, MD  aspirin-acetaminophen-caffeine  (EXCEDRIN MIGRAINE) [phone] MG per tablet Take 2 tablets by mouth every 6 (six) hours as needed for headache or migraine.    Historical Provider, MD  HYDROcodone-acetaminophen (NORCO/VICODIN) 5-325 MG per tablet Take 1 tablet by mouth every 6 (six) hours as needed for severe pain. 03/12/14   Derwood Kaplan, MD  ibuprofen (ADVIL,MOTRIN) 600 MG tablet Take 1 tablet (600 mg total) by mouth every 6 (six) hours as needed. 03/12/14   Derwood Kaplan, MD   Meds Ordered and Administered this Visit  Medications - No data to display  BP 130/86 mmHg  Pulse 78  Temp(Src) 98.1 F (36.7 C) (Oral)  Resp 18  SpO2 100%  LMP 09/04/2015 No data found.   Physical Exam  Constitutional: She is oriented to person, place, and time. She appears well-developed and well-nourished.  HENT:  Head: Normocephalic.  Eyes: Conjunctivae and EOM are normal. Pupils are equal, round, and reactive to light.  Neck: Normal range of motion.  Cardiovascular: Normal rate and normal heart sounds.   Pulmonary/Chest: Effort normal and breath sounds normal.  Abdominal: Soft. She exhibits no distension.  Musculoskeletal: Normal range of motion.  Neurological: She is alert and oriented to person, place, and time.  Skin: Skin is warm.  Psychiatric: She has a normal mood and affect.  Nursing note and vitals reviewed.   ED Course  Procedures (including critical care time)  Labs Review Labs Reviewed - No data to display  Imaging Review No results found.   Visual Acuity Review  Right  Eye Distance:   Left Eye Distance:   Bilateral Distance:    Right Eye Near:   Left Eye Near:    Bilateral Near:         MDM   1. Bronchitis       Elson Areas, PA-C 09/04/15 2032

## 2015-09-04 NOTE — Discharge Instructions (Signed)

## 2015-11-15 ENCOUNTER — Emergency Department (HOSPITAL_COMMUNITY)
Admission: EM | Admit: 2015-11-15 | Discharge: 2015-11-16 | Disposition: A | Discharge status: Home / Self Care | Payer: 59 | Attending: Emergency Medicine | Admitting: Emergency Medicine

## 2015-11-15 ENCOUNTER — Encounter (HOSPITAL_COMMUNITY): Payer: Self-pay

## 2015-11-15 DIAGNOSIS — F430 Acute stress reaction: Secondary | ICD-10-CM

## 2015-11-15 DIAGNOSIS — R6883 Chills (without fever): Secondary | ICD-10-CM | POA: Insufficient documentation

## 2015-11-15 DIAGNOSIS — Z79899 Other long term (current) drug therapy: Secondary | ICD-10-CM | POA: Insufficient documentation

## 2015-11-15 DIAGNOSIS — R0600 Dyspnea, unspecified: Secondary | ICD-10-CM | POA: Diagnosis not present

## 2015-11-15 DIAGNOSIS — R251 Tremor, unspecified: Secondary | ICD-10-CM | POA: Diagnosis not present

## 2015-11-15 DIAGNOSIS — F439 Reaction to severe stress, unspecified: Secondary | ICD-10-CM | POA: Insufficient documentation

## 2015-11-15 DIAGNOSIS — F419 Anxiety disorder, unspecified: Secondary | ICD-10-CM | POA: Insufficient documentation

## 2015-11-15 DIAGNOSIS — R45851 Suicidal ideations: Secondary | ICD-10-CM | POA: Diagnosis present

## 2015-11-15 LAB — RAPID URINE DRUG SCREEN, HOSP PERFORMED
AMPHETAMINES: NOT DETECTED
BARBITURATES: NOT DETECTED
BENZODIAZEPINES: NOT DETECTED
Cocaine: NOT DETECTED
Opiates: NOT DETECTED
TETRAHYDROCANNABINOL: NOT DETECTED
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
## 2015-11-15 NOTE — Discharge Instructions (Signed)
Crisis de [place] (Panic Attacks) Las crisis de [place] son ataques repentinos y [place] de [place], miedo o Tree surgeon extremos. Es posible que ocurran sin motivo, cuando est relajado, ansioso o cuando duerme. Las crisis de [place] pueden ocurrir por algunas de estas razones:   En ocasiones, las personas sanas presentan crisis de [place] en situaciones extremas, potencialmente mortales, como la guerra o los desastres naturales. La ansiedad normal es un mecanismo de defensa del cuerpo que nos ayuda a Firefighter ante situaciones de peligro (respuesta de defensa o huida).  Con frecuencia, las crisis de [place] aparecen acompaadas de trastornos de ansiedad, como trastorno de pnico, trastorno de ansiedad social, trastorno de ansiedad generalizada y fobias. Los trastornos de ansiedad provocan ansiedad excesiva o incontrolable. Sus relaciones y actividades pueden verse Technical sales engineer.  En ocasiones, las crisis de ansiedad se presentan con otras enfermedades mentales, como la depresin y el trastorno por estrs postraumtico.  Algunas enfermedades, medicamentos recetados y drogas pueden provocar crisis de [place]. SNTOMAS  Las crisis de [place] comienzan repentinamente, Artist punto mximo a los 20 minutos y se presentan junto con cuatro o ms de los siguientes sntomas:  Latidos cardacos acelerados o frecuencia cardaca elevada (palpitaciones).  Sudoracin.  Temblores o sacudidas.  Dificultad para respirar o sensacin de asfixia.  Sensacin de [employer].  Dolor o Adult nurse.  Nuseas o sensacin extraa en el estmago.  Mareos, sensacin de desvanecimiento o de desmayo.  Escalofros o sofocos.  Hormigueos o adormecimiento en [place] y los pies.  Sensacin de [employer] no son reales o de que no es usted mismo.  Temor a perder el control o el juicio.  Temor a Corporate treasurer. Algunos de estos sntomas pueden parecerse a enfermedades graves. Por ejemplo, es  posible que piense que tendr un ataque cardaco. Aunque las crisis de [place] pueden ser muy atemorizantes, no son potencialmente mortales. DIAGNSTICO  Las crisis de [place] se diagnostican con una evaluacin que realiza el mdico. Su mdico le realizar preguntas sobre los sntomas, como cundo y dnde ocurrieron. Tambin le preguntar sobre su historia clnica y [place] consumo de alcohol y drogas, incluidos los medicamentos recetados. Es posible que su mdico le indique anlisis de sangre u otros estudios para Teacher, English as a foreign language graves. El mdico podr derivarlo a un profesional de la salud mental para que le realice una evaluacin ms profunda. TRATAMIENTO   En general, las personas sanas que registran una o dos crisis de [place] bajo una situacin extrema, potencialmente mortal, no requerirn Clinical research associate.  El [place] de las crisis de [place] asociadas con trastornos de ansiedad u otras enfermedades mentales, generalmente, requiere orientacin por parte de un profesional de la salud mental medicamentos, o bien la combinacin de [place]. Su mdico le ayudar a Leisure centre manager tratamiento para usted.  Las crisis de [place] asociadas a enfermedades fsicas, generalmente, desaparecen con el tratamiento de la enfermedad. Si un medicamento recetado le causa crisis de [place], consulte a su mdico si debe suspenderlo, disminuir la dosis o sustituirlo por otro medicamento.  Las crisis de [place] asociadas al consumo de drogas o alcohol desaparecen con la abstinencia. Algunos adultos necesitan ayuda profesional para dejar de beber o de consumir drogas. INSTRUCCIONES PARA EL CUIDADO EN EL HOGAR   Tome todos los medicamentos como le indic el mdico.  Planifique y concurra a todas las visitas de control, segn le indique el mdico. Es importante que concurra a todas las visitas. SOLICITE ATENCIN MDICA SI:  No puede  tomar los [employer] se lo han indicado.  Los sntomas no  mejoran o empeoran. SOLICITE ATENCIN MDICA DE INMEDIATO SI:   Experimenta sntomas de crisis de [place] diferentes de los que presenta habitualmente.  Tiene pensamientos serios acerca de lastimarse a usted mismo o daar a Producer, television/film/video.  Toma medicamentos para las crisis de [place] y presenta efectos secundarios graves. ASEGRESE DE QUE:  Comprende estas instrucciones.  Controlar su afeccin.  Recibir ayuda de inmediato si no mejora o si empeora.   Esta informacin no tiene Marine scientist el consejo del mdico. Asegrese de hacerle al mdico cualquier pregunta que tenga.   Document Released: 11/19/2005 Document Revised: 11/24/2013 Elsevier Interactive Patient Education 2016 [employer].  [place] y control del estrs (Stress and Stress Management) El estrs es una reaccin normal a los sucesos de la vida. Es lo que se siente cuando la vida le exige ms de lo que est acostumbrado o ms de lo que puede [place]. Un poco de [employer] ser til. Por ejemplo, la reaccin al estrs puede ayudarlo a tomar el ltimo autobs del da, a estudiar para un examen o a cumplir con un plazo lmite en el trabajo. Sin embargo, el Retail buyer frecuente o que dura mucho tiempo puede causar problemas. Puede afectar la salud emocional y [place] y las actividades cotidianas normales. El exceso de estrs puede Academic librarian sistema inmunitario y aumentar el riesgo de que tenga enfermedades fsicas. Si ya tiene un problema mdico, el estrs puede empeorarlo. CAUSAS  [employer] sucesos de la vida pueden causar estrs. Un suceso que le causa estrs a una persona puede no ser estresante para Theatre manager. Generalmente, los sucesos importantes de la vida causan estrs. Estos pueden ser positivos o negativos, por ejemplo, quedarse sin empleo, mudarse a una casa nueva, casarse, tener un beb o perder a un ser querido. Los sucesos de la vida menos evidentes tambin pueden causar estrs, especialmente  si ocurren da tras da o en combinacin. Por ejemplo, trabajar muchas horas, conducir cuando hay mucho trnsito, cuidar a los nios, tener deudas o tener una relacin difcil. SIGNOS Y SNTOMAS El estrs puede causar sntomas emocionales que incluyen lo siguiente:  Ansiedad. Sentirse preocupado, temeroso, nervioso, abrumado o fuera de control.  Enojo. Sentirse irritado o impaciente.  Depresin. Sentirse triste, decado, desesperanzado o culpable.  Dificultad para concentrarse, recordar o tomar decisiones. El estrs puede causar sntomas fsicos que incluyen lo siguiente:   [place] y [place]. Estos pueden afectar la cabeza, el cuello, la espalda, el estmago u otras zonas del cuerpo.  Rigidez muscular o tensin mandibular.  Poca energa o dificultad para dormir. El estrs puede provocar conductas poco saludables, que incluyen lo siguiente:   Comer para sentirse mejor (comer en exceso) o IT consultant comidas.  Dormir [employer], mucho o ambas cosas.  Trabajar mucho o postergar tareas (posponer).  Fumar, beber alcohol o consumir drogas para sentirse mejor. DIAGNSTICO  El estrs se diagnostica a travs de una evaluacin que realiza el mdico. El mdico le har preguntas sobre los sntomas o cualquier suceso estresante de la vida. Adems, el mdico le har preguntas sobre sus antecedentes mdicos y tal vez indique anlisis de [place] u otros estudios. Ciertas afecciones y algunos medicamentos pueden provocar sntomas fsicos similares a los del estrs. Las enfermedades mentales pueden causar sntomas emocionales y Actor conductas poco sanas similares a los del estrs. El mdico podr derivarlo a un profesional de la salud mental para que le realice una evaluacin  ms profunda.  TRATAMIENTO  El tratamiento recomendado para el estrs es el control del estrs. Los [employer] del control del estrs son reducir los eventos estresantes de la vida y enfrentar el estrs de maneras saludables.  Las  tcnicas para reducir los eventos estresantes de la vida incluyen lo siguiente:  Identificacin del estrs. Autocontrolar el estrs e identificar qu lo causa. Estas habilidades pueden ayudarlo a evitar algunos sucesos estresantes.  Control del tiempo. Establecer las prioridades, llevar un calendario de los sucesos y aprender a Software engineer "no". Estas herramientas pueden ayudarlo a evitar que asuma muchos compromisos. Las tcnicas para lidiar con el estrs incluyen lo siguiente:  Replantearse el problema. Tratar de pensar de un modo realista los eventos estresantes en lugar de ignorarlos o Horticulturist, commercial. Intente encontrar los aspectos positivos en una situacin estresante, en lugar de enfocarse en los negativos.  La actividad fsica. El ejercicio fsico puede liberar la tensin fsica y Architectural technologist. La clave es encontrar un tipo de ejercicio fsico que disfrute y practique con regularidad.  Tcnicas de relajacin. Estas relajan el cuerpo y la [place]. Los ejemplos son el yoga, la meditacin, el tai chi, la biorregulacin, la respiracin profunda, la relajacin muscular progresiva, Conservation officer, nature, estar al [employer] en contacto con la naturaleza, llevar un diario y otros pasatiempos. Nuevamente, la clave es encontrar una o ms opciones que disfrute y Hydrographic surveyor con regularidad.  Estilo de vida saludable. Coma una dieta equilibrada, duerma mucho y no fume. Evite el consumo de alcohol o de drogas para relajarse.  Red de apoyo slida. Pase tiempo con la familia, los amigos y otras personas cuya compaa disfrute. Exprese sus sentimientos y converse acerca de las cosas que le preocupan con alguien en quien confe. La orientacin o la psicoterapiacon un profesional de la salud mental pueden ser de ayuda si tiene dificultades para controlar el estrs por s solo. Generalmente, no se recomiendan medicamentos para el tratamiento del estrs. Hable con el mdico si considera que necesita medicamentos  para los sntomas de estrs. INSTRUCCIONES PARA EL CUIDADO EN EL HOGAR  Concurra a todas las visitas de control como se lo haya indicado el mdico.  Tome todos los medicamentos como se lo haya indicado el mdico. SOLICITE ATENCIN MDICA SI:  Los sntomas empeoran o empieza a tener sntomas nuevos.  Se siente abrumado por los problemas y ya no puede manejarlos solo. SOLICITE ATENCIN MDICA DE INMEDIATO SI:  Siente deseos de lastimarse o lastimar a Nurse, children's.   Esta informacin no tiene Marine scientist el consejo del mdico. Asegrese de hacerle al mdico cualquier pregunta que tenga.   Document Released: 08/29/2005 Document Revised: 12/10/2014 Elsevier Interactive Patient Education [employer].

## 2015-11-28 ENCOUNTER — Encounter (HOSPITAL_COMMUNITY): Payer: Self-pay

## 2015-11-28 ENCOUNTER — Emergency Department (INDEPENDENT_AMBULATORY_CARE_PROVIDER_SITE_OTHER)
Admission: EM | Admit: 2015-11-28 | Discharge: 2015-11-28 | Disposition: A | Discharge status: Home / Self Care | Payer: PRIVATE HEALTH INSURANCE | Source: Home / Self Care | Attending: Emergency Medicine | Admitting: Emergency Medicine

## 2015-11-28 ENCOUNTER — Emergency Department (INDEPENDENT_AMBULATORY_CARE_PROVIDER_SITE_OTHER): Payer: PRIVATE HEALTH INSURANCE

## 2015-11-28 DIAGNOSIS — S8992XA Unspecified injury of left lower leg, initial encounter: Secondary | ICD-10-CM

## 2015-11-28 MED ORDER — HYDROCODONE-ACETAMINOPHEN 5-325 MG PO TABS: 2.0000 | ORAL_TABLET | ORAL | Status: DC | PRN

## 2015-11-28 MED ORDER — IBUPROFEN 800 MG PO TABS
800.0000 mg | ORAL_TABLET | Freq: Once | ORAL | Status: AC
  Administered 2015-11-28: 800 mg via ORAL

## 2015-11-28 MED ORDER — IBUPROFEN 800 MG PO TABS
ORAL_TABLET | ORAL | Status: AC
  Filled 2015-11-28: qty 1

## 2015-11-28 NOTE — ED Notes (Signed)
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 HAD been assisting patient to stand, but pain started AFTER this event. Was not  lifting the patient at the time. Tearful. Ice applied

## 2015-11-28 NOTE — ED Provider Notes (Signed)
CSN: [id number]     Arrival date & time 11/28/15  1306 History   First MD Initiated Contact with Patient 11/28/15 1503     Chief Complaint  Patient presents with  . 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 No pertinent past medical history. Past Surgical History  Procedure Laterality Date  . Appendectomy    . Tubal ligation     Family History  Problem Relation Age of Onset  . Diabetes Mother    Social History  Substance Use Topics  . Smoking status: Never Smoker   . Smokeless tobacco: None  . Alcohol Use: Yes     Comment: occ   OB History    No data available     Review of Systems ROS +'ve left knee pain  Denies: HEADACHE, NAUSEA, ABDOMINAL PAIN, CHEST PAIN, CONGESTION, DYSURIA, SHORTNESS OF BREATH  Allergies  Pork-derived products  Home Medications   Prior to Admission medications   Medication Sig Start Date End Date Taking? Authorizing Provider  albuterol (PROVENTIL HFA;VENTOLIN HFA) 108 (90 BASE) MCG/ACT inhaler Inhale 1 puff into the lungs every 6 (six) hours as needed for wheezing or shortness of breath. 09/04/15   Elson Areas, PA-C  aspirin-acetaminophen-caffeine (EXCEDRIN MIGRAINE) [phone] MG per tablet Take 2 tablets by mouth every 6 (six) hours as needed for headache or migraine.    Historical Provider, MD  azithromycin (ZITHROMAX) 250 MG tablet Take 1 tablet (250 mg total) by mouth daily. Take first 2 tablets together, then 1 every day until finished. Patient not taking: Reported on 11/15/2015 09/04/15   Elson Areas, PA-C  HYDROcodone-acetaminophen (NORCO/VICODIN)  5-325 MG per tablet Take 1 tablet by mouth every 6 (six) hours as needed for severe pain. Patient not taking: Reported on 11/15/2015 03/12/14   Derwood Kaplan, MD  ibuprofen (ADVIL,MOTRIN) 600 MG tablet Take 1 tablet (600 mg total) by mouth every 6 (six) hours as needed. Patient not taking: Reported on 11/15/2015 03/12/14   Derwood Kaplan, MD   Meds Ordered and Administered this Visit   Medications  ibuprofen (ADVIL,MOTRIN) tablet 800 mg (not administered)    BP 114/69 mmHg  Pulse 90  Temp(Src) 98.2 F (36.8 C) (Oral)  Resp 16  SpO2 96%  LMP 11/08/2015 (Approximate) No data found.   Physical Exam  Constitutional: She appears well-developed and well-nourished.  Musculoskeletal: She exhibits tenderness.       Left knee: She exhibits no swelling, no effusion, no deformity, normal alignment, no LCL laxity, normal patellar mobility and no bony tenderness. Tenderness found. No patellar tendon tenderness noted.       Legs: Nursing note and vitals reviewed.   ED Course  Procedures (including critical care time)  Labs Review Labs Reviewed - No data to display  Imaging Review Dg Knee Complete 4 Views Left  11/28/2015  CLINICAL DATA:  34 year old female with left knee pain EXAM: LEFT KNEE - COMPLETE 4+ VIEW COMPARISON:  None. FINDINGS: There is no evidence of fracture, dislocation, or joint effusion. There is no evidence of arthropathy or other focal bone abnormality. Soft tissues are unremarkable. IMPRESSION: Negative. Electronically Signed   By: Malachy Moan M.D.   On: 11/28/2015 16:21     Visual Acuity Review  Right Eye Distance:  Left Eye Distance:   Bilateral Distance:    Right Eye Near:   Left Eye Near:    Bilateral Near:         MDM   1. Knee injury, left, initial encounter    Review of x-ray with patient no acute bony injury noted. Joint is stable to valgus and varus stresses and anterior drawer sign. Lachman's test is negative. Very unlikely that this is  a knee problem. She is having a great deal of tenderness in the posterior upper calf. Which is most consistent with a muscle strain or tear versus knee injury. Is no mechanism that she itching that we give her an acute knee injury. Patient is advised to contact occupational health or employee health services tomorrow. Immobilizer and crutches were applied. Tylenol and ibuprofen was advised for pain along with ice elevation and no weight bearing. And on the work note was provided to the patient. Instructions care provided discharged home in stable condition.    Tharon AquasFrank C Patrick, PA 11/28/15 [phone]

## 2015-11-28 NOTE — Discharge Instructions (Signed)
Knee Pain Knee pain is a common problem. It can have many causes. The pain often goes away by following your doctor's home care instructions. Treatment for ongoing pain will depend on the cause of your pain. If your knee pain continues, more tests may be needed to diagnose your condition. Tests may include X-rays or other imaging studies of your knee. HOME CARE  Take medicines only as told by your doctor.  Rest your knee and keep it raised (elevated) while you are resting.  Do not do things that cause pain or make your pain worse.  Avoid activities where both feet leave the ground at the same time, such as running, jumping rope, or doing jumping jacks.  Apply ice to the knee area:  Put ice in a plastic bag.  Place a towel between your skin and the bag.  Leave the ice on for 20 minutes, 2-3 times a day.  Ask your doctor if you should wear an elastic knee support.  Sleep with a pillow under your knee.  Lose weight if you are overweight. Being overweight can make your knee hurt more.  Do not use any tobacco products, including cigarettes, chewing tobacco, or electronic cigarettes. If you need help quitting, ask your doctor. Smoking may slow the healing of any bone and joint problems that you may have. GET HELP IF:  Your knee pain does not stop, it changes, or it gets worse.  You have a fever along with knee pain.  Your knee gives out or locks up.  Your knee becomes more swollen. GET HELP RIGHT AWAY IF:   Your knee feels hot to the touch.  You have chest pain or trouble breathing.   This information is not intended to replace advice given to you by your health care provider. Make sure you discuss any questions you have with your health care provider.   Document Released: 02/15/2009 Document Revised: 12/10/2014 Document Reviewed: 01/20/2014 Elsevier Interactive Patient Education 2016 [employer] Knee Immobilizer A knee immobilizer, also known as a knee brace, supports  your knee and keeps you from bending it. The knee brace may be a splint or a cast. Wear your knee brace and remove the knee brace as told by your doctor. HOME CARE   Use powder (such as baby or talcum powder) to help with sweat and rubbing.  Adjust the brace as often as needed while wearing it. It should be firm but not tight. Signs that the brace is too tight include:  Puffiness (swelling).  Numbness.  Color change in your foot or ankle.  Increased pain.  While resting, keep your leg above the level of your heart. Use pillows for support.  Remove the knee brace to bathe and sleep. GET HELP RIGHT AWAY IF:   There is more pain or puffiness in the knee, foot, or ankle.  You have problems because of the brace or the brace breaks. MAKE SURE YOU:   Understand these instructions.  Will watch this condition.  Will get help right away if you are not doing well or get worse.   This information is not intended to replace advice given to you by your health care provider. Make sure you discuss any questions you have with your health care provider.   Document Released: 08/28/2008 Document Revised: 11/24/2013 Document Reviewed: 07/13/2013 Elsevier Interactive Patient Education 2016 [employer] Crutch Use Crutches take weight off one of your legs or feet when you stand or walk. It is important to use crutches  that fit right. Your crutches fit right if:  You can fit 2-3 fingers between your armpit and the crutch.  You use your hands, not your armpits, to hold yourself up. Do not put your armpits on the crutches. This can damage the nerves in your hands and arms. Crutches should be a little below your armpits. HOW TO USE YOUR CRUTCHES Walking  Step with the crutches.  Swing the good leg a little bit in front of the crutches. Going Up Steps If there is no handrail:  Step up with the good leg.  Step up with the crutches and hurt leg.  Continue in this way. If there is a  handrail: 1. Hold both crutches in one hand. 2. Place your free hand on the handrail. 3. Put your weight on your arms and lift your good leg to the step. 4. Bring the crutches and the hurt leg up to that step. 5. Continue in this way. Going Down Steps Be very careful, as going down stairs with crutches is very challenging. If there is no handrail: 1. Step down with the hurt leg and crutches. 2. Step down with the good leg. If there is a handrail: 1. Place your hand on the handrail. 2. Hold both crutches with your free hand. 3. Lower your hurt leg and crutch to the step below you. Make sure to keep the crutch tips in the center of the step, never on the edge. 4. Lower your good leg to that step. 5. Continue in this way. Standing Up 1. Hold the hurt leg forward. 2. Grab the armrest with one hand and the top of the crutches with the other hand. 3. Pull yourself up to a standing position. Sitting Down 1. Hold the hurt leg forward. 2. Grab the armrest with one hand and the top of the crutches with the other hand. 3.  Lower yourself to a sitting position. GET HELP IF:  You still feel wobbly on your feet.  You develop new pain, for example in your armpits, back, shoulder, wrist, or hip.  You cannot feel a part of your body (numb).  You have tingling. GET HELP RIGHT AWAY IF: You fall.   This information is not intended to replace advice given to you by your health care provider. Make sure you discuss any questions you have with your health care provider.   Document Released: 05/07/2008 Document Revised: 09/09/2013 Document Reviewed: 07/27/2013 Elsevier Interactive Patient Education [employer]

## 2015-12-15 ENCOUNTER — Other Ambulatory Visit (HOSPITAL_COMMUNITY): Payer: Self-pay | Admitting: Family Medicine

## 2015-12-16 ENCOUNTER — Other Ambulatory Visit (HOSPITAL_COMMUNITY): Payer: Self-pay | Admitting: Family Medicine

## 2015-12-16 DIAGNOSIS — S8392XA Sprain of unspecified site of left knee, initial encounter: Secondary | ICD-10-CM

## 2015-12-19 ENCOUNTER — Ambulatory Visit (HOSPITAL_COMMUNITY)
Admission: RE | Admit: 2015-12-19 | Discharge: 2015-12-19 | Disposition: A | Discharge status: Home / Self Care | Payer: 59 | Source: Ambulatory Visit | Attending: Family Medicine | Admitting: Family Medicine

## 2015-12-19 DIAGNOSIS — W19XXXA Unspecified fall, initial encounter: Secondary | ICD-10-CM | POA: Insufficient documentation

## 2015-12-19 DIAGNOSIS — S8392XA Sprain of unspecified site of left knee, initial encounter: Secondary | ICD-10-CM | POA: Insufficient documentation

## 2015-12-26 ENCOUNTER — Ambulatory Visit: Payer: PRIVATE HEALTH INSURANCE | Attending: Orthopedic Surgery | Admitting: Physical Therapy

## 2015-12-26 ENCOUNTER — Encounter: Payer: Self-pay | Admitting: Physical Therapy

## 2015-12-26 DIAGNOSIS — M25562 Pain in left knee: Secondary | ICD-10-CM | POA: Insufficient documentation

## 2015-12-26 DIAGNOSIS — R29898 Other symptoms and signs involving the musculoskeletal system: Secondary | ICD-10-CM | POA: Diagnosis present

## 2015-12-26 DIAGNOSIS — R6889 Other general symptoms and signs: Secondary | ICD-10-CM | POA: Insufficient documentation

## 2015-12-26 DIAGNOSIS — R600 Localized edema: Secondary | ICD-10-CM | POA: Diagnosis present

## 2015-12-26 DIAGNOSIS — R262 Difficulty in walking, not elsewhere classified: Secondary | ICD-10-CM | POA: Diagnosis present

## 2015-12-26 DIAGNOSIS — M25662 Stiffness of left knee, not elsewhere classified: Secondary | ICD-10-CM

## 2015-12-26 NOTE — Patient Instructions (Addendum)
IONTOPHORESIS PATIENT PRECAUTIONS & CONTRAINDICATIONS:  . Redness under one or both electrodes can occur.  This characterized by a uniform redness that usually disappears within 12 hours of treatment. . Small pinhead size blisters may result in response to the drug.  Contact your physician if the problem persists more than 24 hours. . On rare occasions, iontophoresis therapy can result in temporary skin reactions such as rash, inflammation, irritation or burns.  The skin reactions may be the result of individual sensitivity to the ionic solution used, the condition of the skin at the start of treatment, reaction to the materials in the electrodes, allergies or sensitivity to dexamethasone, or a poor connection between the patch and your skin.  Discontinue using iontophoresis if you have any of these reactions and report to your therapist. . Remove the Patch or electrodes if you have any undue sensation of pain or burning during the treatment and report discomfort to your therapist. . Tell your Therapist if you have had known adverse reactions to the application of electrical current. . If using the Patch, the LED light will turn off when treatment is complete and the patch can be removed.  Approximate treatment time is 1-3 hours.  Remove the patch when light goes off or after 6 hours. . The Patch can be worn during normal activity, however excessive motion where the electrodes have been placed can cause poor contact between the skin and the electrode or uneven electrical current resulting in greater risk of skin irritation. Marland Kitchen Keep out of the reach of children.   . DO NOT use if you have a cardiac pacemaker or any other electrically sensitive implanted device. . DO NOT use if you have a known sensitivity to dexamethasone. . DO NOT use during Magnetic Resonance Imaging (MRI). . DO NOT use over broken or compromised skin (e.g. sunburn, cuts, or acne) due to the increased risk of skin reaction. . DO  NOT SHAVE over the area to be treated:  To establish good contact between the Patch and the skin, excessive hair may be clipped. . DO NOT place the Patch or electrodes on or over your eyes, directly over your heart, or brain. . DO NOT reuse the Patch or electrodes as this may cause burns to occur.    Copyright  VHI. All rights reserved.  HIP: Flexion / KNEE: Extension, Straight Leg Raise   Raise leg, keeping knee straight. Perform slowly. __10_ reps per set, _3__ sets per day, __7_ days per week   Copyright  VHI. All rights reserved.  Heel Slide   Bend knee and pull heel toward buttocks. Hold _3-5___ seconds. Return. Repeat with other knee. Repeat _10x2___ times. Do _1-2___ sessions per day.  http://gt2.exer.us/372   Copyright  VHI. All rights reserved.    Quad Set   Slowly tighten muscles on thigh of straight leg while counting out loud to __5__. Towel roll under left knee. Repeat _10 x 2___ times. Do _1-2___ sessions per day.  http://gt2.exer.us/361   Copyright  VHI. All rights reserved.  ROM: Inversion / Eversion   Copyright  VHI. All rights reserved.  ROM: Plantar / Dorsiflexion   With left leg relaxed, gently flex and extend ankle. Move through full range of motion. Avoid pain. Repeat _10___ times per set. Do _2___ sets per session. Do _2-3___ sessions per day. Ankle pumps      Cryotherapy Cryotherapy means treatment with cold. Ice or gel packs can be used to reduce both pain and swelling. Ice is  the most helpful within the first 24 to 48 hours after an injury or flare-up from overusing a muscle or joint. Sprains, strains, spasms, burning pain, shooting pain, and aches can all be eased with ice. Ice can also be used when recovering from surgery. Ice is effective, has very few side effects, and is safe for most people to use. PRECAUTIONS  Ice is not a safe treatment option for people with:  Raynaud phenomenon. This is a condition affecting small blood  vessels in the extremities. Exposure to cold may cause your problems to return.  Cold hypersensitivity. There are many forms of cold hypersensitivity, including:  Cold urticaria. Red, itchy hives appear on the skin when the tissues begin to warm after being iced.  Cold erythema. This is a red, itchy rash caused by exposure to cold.  Cold hemoglobinuria. Red blood cells break down when the tissues begin to warm after being iced. The hemoglobin that carry oxygen are passed into the urine because they cannot combine with blood proteins fast enough.  Numbness or altered sensitivity in the area being iced. If you have any of the following conditions, do not use ice until you have discussed cryotherapy with your caregiver:  Heart conditions, such as arrhythmia, angina, or chronic heart disease.  High blood pressure.  Healing wounds or open skin in the area being iced.  Current infections.  Rheumatoid arthritis.  Poor circulation.  Diabetes. Ice slows the blood flow in the region it is applied. This is beneficial when trying to stop inflamed tissues from spreading irritating chemicals to surrounding tissues. However, if you expose your skin to cold temperatures for too long or without the proper protection, you can damage your skin or nerves. Watch for signs of skin damage due to cold. HOME CARE INSTRUCTIONS Follow these tips to use ice and cold packs safely.  Place a dry or damp towel between the ice and skin. A damp towel will cool the skin more quickly, so you may need to shorten the time that the ice is used.  For a more rapid response, add gentle compression to the ice.  Ice for no more than 10 to 20 minutes at a time. The bonier the area you are icing, the less time it will take to get the benefits of ice.  Check your skin after 5 minutes to make sure there are no signs of a poor response to cold or skin damage.  Rest 20 minutes or more between uses.  Once your skin is numb,  you can end your treatment. You can test numbness by very lightly touching your skin. The touch should be so light that you do not see the skin dimple from the pressure of your fingertip. When using ice, most people will feel these normal sensations in this order: cold, burning, aching, and numbness.  Do not use ice on someone who cannot communicate their responses to pain, such as small children or people with dementia. HOW TO MAKE AN ICE PACK Ice packs are the most common way to use ice therapy. Other methods include ice massage, ice baths, and cryosprays. Muscle creams that cause a cold, tingly feeling do not offer the same benefits that ice offers and should not be used as a substitute unless recommended by your caregiver. To make an ice pack, do one of the following:  Place crushed ice or a bag of frozen vegetables in a sealable plastic bag. Squeeze out the excess air. Place this bag inside another plastic  bag. Slide the bag into a pillowcase or place a damp towel between your skin and the bag.  Mix 3 parts water with 1 part rubbing alcohol. Freeze the mixture in a sealable plastic bag. When you remove the mixture from the freezer, it will be slushy. Squeeze out the excess air. Place this bag inside another plastic bag. Slide the bag into a pillowcase or place a damp towel between your skin and the bag. SEEK MEDICAL CARE IF:  You develop white spots on your skin. This may give the skin a blotchy (mottled) appearance.  Your skin turns blue or pale.  Your skin becomes waxy or hard.  Your swelling gets worse. MAKE SURE YOU:   Understand these instructions.  Will watch your condition.  Will get help right away if you are not doing well or get worse. Document Released: 07/16/2011 Document Revised: 04/05/2014 Document Reviewed: 07/16/2011 [facility] Patient Information 2015 [place], [place]. This information is not intended to replace advice given to you by your health care provider. Make sure  you discuss any questions you have with your health care provider.  Garen Lah, PT 12/26/2015 1:12 PM Phone: [phone] Fax: [phone]

## 2015-12-26 NOTE — Therapy (Signed)
[facility] Outpatient Rehabilitation [facility] [address] [place], [place], [address] Phone: [phone]   Fax:  [phone]  Physical Therapy Evaluation  Patient Details  Name: Tara Hartman MRN: [id number] Date of Birth: September 25, 1981 Referring Provider: Dr. Feliberto Gottron. Turner Daniels  Encounter Date: 12/26/2015      PT End of Session - 12/26/15 1225    Visit Number 1   Number of Visits 12   Date for PT Re-Evaluation 02/06/16   Authorization Type [place]/ [employer]	[place]   Authorization - Visit Number 1   Authorization - Number of Visits 12   PT Start Time 1225   PT Stop Time 1326   PT Time Calculation (min) 61 min   Activity Tolerance Patient limited by pain   Behavior During Therapy [facility] for tasks assessed/performed      History reviewed. No pertinent past medical history.  Past Surgical History  Procedure Laterality Date  . Appendectomy    . Tubal ligation      There were no vitals filed for this visit.  Visit Diagnosis:  Localized edema  Left knee pain  Decreased ROM of left knee  Activity intolerance  Difficulty walking  Difficulty walking up stairs      Subjective Assessment - 12/26/15 1232    Subjective I was working and transferring pt and I injured knee during the transfer blocking wiht knee and heard a pop.  and then 2 hours later left knee begain swelling   Pertinent History November 28, 2015 . on light duty   Limitations Standing;Walking;House hold activities   How long can you sit comfortably? 2 hours   How long can you stand comfortably? 5 min   How long can you walk comfortably? 5 min   Currently in Pain? Yes   Pain Score 7    Pain Location Knee   Pain Orientation Left   Pain Descriptors / Indicators Sharp;Burning   Pain Type Acute pain   Pain Onset 1 to 4 weeks ago   Pain Frequency Intermittent   Aggravating Factors  stairs, ADL's removing shoes, , walking, any movement of knee   Pain Relieving Factors RICE             [facility] PT Assessment - 12/26/15 1241    Assessment   Medical Diagnosis left knee soft tissue pain   Referring Provider Dr. Feliberto Gottron. [place]   Onset Date/Surgical Date 11/28/15   Hand Dominance Right   Prior Therapy none   Precautions   Precautions Other (comment);Knee   Precaution Comments no lifting of greater ythan  5 lbs   Required Braces or Orthoses Other Brace/Splint   Restrictions   Weight Bearing Restrictions Yes   LLE Weight Bearing Partial weight bearing   LLE Partial Weight Bearing Percentage or Pounds 75   Balance Screen   Has the patient fallen in the past 6 months No   Has the patient had a decrease in activity level because of a fear of falling?  No   Is the patient reluctant to leave their home because of a fear of falling?  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,  pt care  Cognition   Overall Cognitive Status Within Functional Limits for tasks assessed   Observation/Other Assessments   Focus on Therapeutic Outcomes (FOTO)  intake, 23%, limitation 77% predicted 50%   Circumferential Edema   Circumferential - Right 36.5  cm mid patellare   Circumferential - Left  38.5   Functional Tests   Functional tests Squat   Squat   Comments unable to squat due to pain   AROM   Right Knee Extension 0   Right Knee Flexion 140   Left Knee Extension 19  lag on edge of mat   Left Knee Flexion 72   Right Ankle Dorsiflexion 10   Right Ankle Plantar Flexion 54   Left Ankle Dorsiflexion 3   Left Ankle Plantar Flexion 45   Strength   Right Knee Flexion 4+/5   Right Knee Extension 4+/5   Left Knee Flexion 3-/5   Left Knee Extension 3-/5    Flexibility   Soft Tissue Assessment /Muscle Length yes   Hamstrings limited due to pain and limited gastroc on right as well limited by pain   Palpation   Patella mobility Decreased patellar mobility on left , pain limiting ROM   Palpation comment tenderness medical and lateral fat pad, medial collateral and lateral gastroc and around fibular lead on L LE   Ambulation/Gait   Ambulation/Gait Yes   Ambulation/Gait Assistance 6: Modified independent (Device/Increase time)   Assistive device L Axillary Crutch   Gait velocity 2.11 ft/sec   Stairs No  pt reports unable to negotiate steps without help of family   Gait Comments Pt enters clinic with one crutch on left side and marked antalgic gait.  Pt educated on gait and told to utilize two crutches for [facility] and more normalized gait                   [facility] Adult PT Treatment/Exercise - 12/26/15 1241    Knee/Hip Exercises: Supine   Quad Sets Strengthening;1 set;10 reps   Quad Sets Limitations VC for execution   Short Arc [employer] Strengthening;1 set;10 reps   Short Arc [employer] Limitations VC   Straight Leg Raises Left;10 reps   Straight Leg Raises Limitations VC for execution   Modalities   Modalities Cryotherapy   Iontophoresis   Type of Iontophoresis Dexamethasone   Location left knee, medial and lateral knee   Dose 1 cc x 2 each patch   Time to be removed after 4 hours and given Iontophoresis pt education   Vasopneumatic   Number Minutes Vasopneumatic  10 minutes   Vasopnuematic Location  Knee  left   Vasopneumatic Pressure Low   Vasopneumatic Temperature  32 Dgrees  pain and needed to be taken off after 10 minutes                PT Education - 12/26/15 1312    Education provided Yes   Education Details POC, Explanation of finding. Iontophoresis education, cryotherapy. Initial Exercise   Person(s) Educated Patient   Methods Explanation;Demonstration;Verbal cues;Tactile cues;Handout   Comprehension  Verbalized understanding;Returned demonstration          PT Short Term Goals - 12/26/15 1224    PT SHORT TERM GOAL #1   Title "Independent with initial HEP 01-16-16   Time 3   Period Weeks   Status New   PT SHORT TERM GOAL #2   Title "Report pain decrease at rest from  7 /10 to  3 /10 ( 01-16-16)  Time 3   Period Weeks   Status New   PT SHORT TERM GOAL #3   Title "Demonstrate and verbalize understanding of condition management including RICE, positioning, use of A.D., HEP.  ( 01-16-16   Time 3   Period Weeks   Status New   PT SHORT TERM GOAL #4   Title Pt will be able to ambulate with proper gait using two crutches ( 01-16-16   Time 3   Period Weeks   Status New           PT Long Term Goals - 12/26/15 1226    PT LONG TERM GOAL #1   Title "Pt will be independent with advanced HEP.    Time 6   Period Weeks   Status New   PT LONG TERM GOAL #2   Title "Pain will decrease to 1/10 with all functional activities   Time 6   Period Weeks   Status New   PT LONG TERM GOAL #3   Title "Pt will tolerate standing and walking for 2 hours without increased pain in order to return to PLOF   Time 6   Period Weeks   Status New   PT LONG TERM GOAL #4   Title "FOTO will improve from  77% liimitation   to  50% limitation   indicating improved functional mobility    Time 6   Period Weeks   Status New   PT LONG TERM GOAL #5   Title Pt will be able to bend knee within 10 degrees of left (Left 140, Right is 72) in order to be able to perform transitional movements with greater ease    Time 6   Period Weeks   Status New   Additional Long Term Goals   Additional Long Term Goals Yes   PT LONG TERM GOAL #6   Title Pt will be able to negotiate steps without exacerbation of pain   Time 6   Period Weeks   Status New               Plan - 12/26/15 1803    Clinical Impression Statement Pt is a 35  year old female who c/o if increased knee pain after December 26,2016 when  transferring a pt and heard a pop in left knee.  Pt with 7/10 pain and increasing in pain and intensity since incident.  Pt enters clinic with one crutch and instructed to  utilize two for more normalized gait and PWB status. Pt with marked tenderness on palpation of lateral right gastroc, posterior to fibular head.  Bilateral marked tenderness and swelling on medial an lateral fat pad and mediial collateral ligament.  Pt 2 cm difference in edema between left and right knee.   Pt given iontophoresis patches for inflammation around knee and information regarding use.  Pt has decreased ROM, decreased strength, inability to negotiate steps, abnormal gait and depenedence on assistive device as well as edema.  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 Pt would benefit from skilled PT for 2 times a week for 6 weeks to address above impariments and functional limitations and return to pain-free PLOF.   Pt will benefit from skilled therapeutic intervention in order to improve on the following deficits Abnormal gait   Rehab Potential Excellent   PT Frequency 2x / week   PT Duration 6 weeks   PT Treatment/Interventions ADLs/Self Care Home Management;Cryotherapy;Electrical Stimulation;Iontophoresis /ml Dexamethasone;Moist  Heat;Therapeutic exercise;Therapeutic activities;Functional mobility training;Stair training;Gait training;Ultrasound;Neuromuscular re-education;Patient/family education;Manual techniques;Vasopneumatic Device;Taping;Dry needling;Passive range of motion   PT Next Visit Plan GAit, modalities for pain.  pre gait activities,  review exericises and complete level 1 knee execisies without increasedin pain   PT Home Exercise Plan GAit with two crutches and SLR, Quad set and RICE   Consulted and Agree with Plan of Care Patient         Problem List There are no active problems to display for this patient.  Garen Lah, PT 12/26/2015 6:17 PM Phone: [phone] Fax:  [phone]  [facility] Outpatient Rehabilitation [facility] [address] [place], [place], [address] Phone: [phone]   Fax:  [phone]  Name: Chlora Mcbain MRN: [id number] Date of Birth: [birth year]

## 2015-12-28 ENCOUNTER — Ambulatory Visit: Payer: PRIVATE HEALTH INSURANCE | Attending: Orthopedic Surgery | Admitting: Physical Therapy

## 2015-12-28 DIAGNOSIS — R600 Localized edema: Secondary | ICD-10-CM | POA: Diagnosis not present

## 2015-12-28 DIAGNOSIS — R262 Difficulty in walking, not elsewhere classified: Secondary | ICD-10-CM

## 2015-12-28 DIAGNOSIS — R29898 Other symptoms and signs involving the musculoskeletal system: Secondary | ICD-10-CM | POA: Insufficient documentation

## 2015-12-28 DIAGNOSIS — R6889 Other general symptoms and signs: Secondary | ICD-10-CM

## 2015-12-28 DIAGNOSIS — M25562 Pain in left knee: Secondary | ICD-10-CM | POA: Diagnosis present

## 2015-12-28 DIAGNOSIS — M25662 Stiffness of left knee, not elsewhere classified: Secondary | ICD-10-CM

## 2015-12-28 NOTE — Therapy (Signed)
[place], [place], [address] Phone: [phone]   Fax:  [phone]  Physical Therapy Treatment  Patient Details  Name: Tara Hartman MRN: [id number] Date of Birth: 11-06-1981 Referring Provider: Dr. Kathalene Frames. Mayer Camel  Encounter Date: 12/28/2015      PT End of Session - 12/28/15 0847    Visit Number 2   Number of Visits 12   Date for PT Re-Evaluation 02/06/16   Authorization Type [place]/ [place] Time [phone]   PT Stop Time 0935   PT Time Calculation (min) 55 min   Activity Tolerance Patient tolerated treatment well   Behavior During Therapy [facility] for tasks assessed/performed      No past medical history on file.  Past Surgical History  Procedure Laterality Date  . Appendectomy    . Tubal ligation      There were no vitals filed for this visit.  Visit Diagnosis:  Localized edema  Left knee pain  Decreased ROM of left knee  Activity intolerance  Difficulty walking  Difficulty walking up stairs      Subjective Assessment - 12/28/15 0844    Subjective Pain is 6/10.  Pt stating frustration due to not getting paid, needs to call HR today. Walks with 1 crutch, is swollen today.    Diagnostic tests MRI    Currently in Pain? Yes   Pain Score 6    Pain Location Knee   Pain Orientation Left   Pain Descriptors / Indicators Aching;Sore   Pain Type Acute pain   Pain Onset More than a month ago   Pain Frequency Intermittent                         [facility] Adult PT Treatment/Exercise - 12/28/15 0856    Knee/Hip Exercises: Aerobic   Nustep level 1 using UE to assist, 5 min    Knee/Hip Exercises: Seated   Long Arc Quad AAROM;Strengthening;1 set;10 reps   Heel Slides AAROM;1 set;10 reps   Knee/Hip Exercises: Supine   Quad Sets Left;1 set;10 reps   Quad Sets Limitations VCs for sustained contraction    Short Arc Quad Sets Strengthening;Left;1 set;20 reps   Heel Slides AAROM;Left;1 set;10 reps   Straight Leg Raises Left;1 set;10 reps   Straight Leg Raises Limitations unable to do full ROM, modified with knee flexed   Cryotherapy   Number Minutes Cryotherapy 15 Minutes   Cryotherapy Location Knee   Type of Cryotherapy Ice pack   Electrical Stimulation   Electrical Stimulation Location L knee   Electrical Stimulation Action IFC   Electrical Stimulation Parameters to tol (9)   Electrical Stimulation Goals Edema;Pain                PT Education - 12/28/15 0846    Education provided Yes   Education Details AAROM and reviewed HEP   Person(s) Educated Patient   Methods Explanation   Comprehension Verbalized understanding;Returned demonstration          PT Short Term Goals - 12/28/15 0922    PT SHORT TERM GOAL #1   Title "Independent with initial HEP 01-16-16   Status On-going   PT SHORT TERM GOAL #2   Title "Report pain decrease at rest from  7 /10 to  3 /10 ( 01-16-16)   Status On-going   PT SHORT TERM GOAL #3   Title "Demonstrate and verbalize understanding of condition management including  RICE, positioning, use of A.D., HEP.  ( 01-16-16   Status On-going   PT SHORT TERM GOAL #4   Title Pt will be able to ambulate with proper gait using two crutches ( 01-16-16   Status On-going           PT Long Term Goals - 12/28/15 0923    PT LONG TERM GOAL #1   Title "Pt will be independent with advanced HEP.    Status On-going   PT LONG TERM GOAL #2   Title "Pain will decrease to 1/10 with all functional activities   Status On-going   PT LONG TERM GOAL #3   Title "Pt will tolerate standing and walking for 2 hours without increased pain in order to return to PLOF   Status On-going   PT LONG TERM GOAL #4   Title "FOTO will improve from  77% liimitation   to  50% limitation   indicating improved functional mobility    Status On-going   PT LONG TERM GOAL #5   Title Pt will be able to bend knee within 10 degrees of left (Left  140, Right is 72) in order to be able to perform transitional movements with greater ease    Status On-going   PT LONG TERM GOAL #6   Title Pt will be able to negotiate steps without exacerbation of pain   Status On-going               Plan - 12/28/15 7543    Clinical Impression Statement Patient tolerated AAROM with difficulty, pain and stiffness.  No goals met, 2nd visit.  Needs encouragement.  Ionto patch caused her to have mroe pain than usual that night.  Used 1 crutch today again.  Urged hee to try 2 for safety. Trial if IFC, may use TENS while working intermittently  if favorable.    PT Next Visit Plan GAit, modalities for pain.  pre gait activities,  review exericises and complete level 1 knee execisies without increasedin pain   PT Home Exercise Plan GAit with two crutches and SLR, Quad set and RICE   Consulted and Agree with Plan of Care Patient        Problem List There are no active problems to display for this patient.   Nomi Rudnicki 12/28/2015, 9:26 AM  [place], [place], [address] Phone: [phone]   Fax:  [phone]  Name: Meris Reede MRN: [id number] Date of Birth: March 06, 1981    [name], PT 12/28/2015 9:26 AM Phone: [phone] Fax: [phone]

## 2015-12-30 ENCOUNTER — Ambulatory Visit: Payer: PRIVATE HEALTH INSURANCE | Admitting: Physical Therapy

## 2015-12-30 DIAGNOSIS — R600 Localized edema: Secondary | ICD-10-CM

## 2015-12-30 DIAGNOSIS — R6889 Other general symptoms and signs: Secondary | ICD-10-CM

## 2015-12-30 DIAGNOSIS — R262 Difficulty in walking, not elsewhere classified: Secondary | ICD-10-CM

## 2015-12-30 DIAGNOSIS — M25562 Pain in left knee: Secondary | ICD-10-CM

## 2015-12-30 DIAGNOSIS — M25662 Stiffness of left knee, not elsewhere classified: Secondary | ICD-10-CM

## 2015-12-30 NOTE — Therapy (Signed)
[place], [place], [address] Phone: [phone]   Fax:  [phone]  Physical Therapy Treatment  Patient Details  Name: Tara Hartman MRN: [id number] Date of Birth: Jan 31, 1981 Referring Provider: Dr. Kathalene Frames. Mayer Camel  Encounter Date: 12/30/2015      PT End of Session - 12/30/15 0836    Visit Number 3   Number of Visits 12   Date for PT Re-Evaluation 02/06/16   PT Start Time 0845   PT Stop Time 0935   PT Time Calculation (min) 50 min   Activity Tolerance Patient tolerated treatment well   Behavior During Therapy [facility] for tasks assessed/performed      No past medical history on file.  Past Surgical History  Procedure Laterality Date  . Appendectomy    . Tubal ligation      There were no vitals filed for this visit.  Visit Diagnosis:  Localized edema  Left knee pain  Decreased ROM of left knee  Activity intolerance  Difficulty walking  Difficulty walking up stairs      Subjective Assessment - 12/30/15 0837    Subjective Not using crutches today.  Pain is 5/10, no change in aggravating/alleviating factors.  Felt good after the other day's treatment.    Currently in Pain? Yes   Pain Score 5           NuStep for 6 min level 4 LE only.        [employer] Adult PT Treatment/Exercise - 12/30/15 0846    Knee/Hip Exercises: Stretches   Active Hamstring Stretch Left;3 reps;30 seconds   Gastroc Stretch Left;2 reps   Soleus Stretch Left;2 reps;30 seconds   Knee/Hip Exercises: Supine   Quad Sets Strengthening;Left;1 set;20 reps   Short Arc Quad Sets Left;1 set;20 reps   Heel Slides AAROM;Both;3 sets;10 reps;Other (comment)   Heel Slides Limitations on ball    [place] Limitations x 10 on ball too difficulty, modified    Straight Leg Raises Strengthening;Left;2 sets;10 reps   Knee Flexion AAROM;Left;1 set;10 reps   Cryotherapy   Number Minutes Cryotherapy 10 Minutes   Cryotherapy Location  Knee   Type of Cryotherapy Ice pack   Ultrasound   Ultrasound Location L lateral knee    Ultrasound Parameters 20%, 0.8    Ultrasound Goals Edema;Pain                PT Education - 12/30/15 1127    Education provided No          PT Short Term Goals - 12/28/15 0922    PT SHORT TERM GOAL #1   Title "Independent with initial HEP 01-16-16   Status On-going   PT SHORT TERM GOAL #2   Title "Report pain decrease at rest from  7 /10 to  3 /10 ( 01-16-16)   Status On-going   PT SHORT TERM GOAL #3   Title "Demonstrate and verbalize understanding of condition management including RICE, positioning, use of A.D., HEP.  ( 01-16-16   Status On-going   PT SHORT TERM GOAL #4   Title Pt will be able to ambulate with proper gait using two crutches ( 01-16-16   Status On-going           PT Long Term Goals - 12/28/15 0923    PT LONG TERM GOAL #1   Title "Pt will be independent with advanced HEP.    Status On-going   PT LONG TERM GOAL #2   Title "Pain  will decrease to 1/10 with all functional activities   Status On-going   PT LONG TERM GOAL #3   Title "Pt will tolerate standing and walking for 2 hours without increased pain in order to return to PLOF   Status On-going   PT LONG TERM GOAL #4   Title "FOTO will improve from  77% liimitation   to  50% limitation   indicating improved functional mobility    Status On-going   PT LONG TERM GOAL #5   Title Pt will be able to bend knee within 10 degrees of left (Left 140, Right is 72) in order to be able to perform transitional movements with greater ease    Status On-going   PT LONG TERM GOAL #6   Title Pt will be able to negotiate steps without exacerbation of pain   Status On-going               Plan - 12/30/15 6619    Clinical Impression Statement Pt does not have WB precautions, urged her to err on the side of safery and use at least 1 crutch.  Skin was irritated after IFC.  Decr pain tolerance.  No goals met.     PT Next  Visit Plan GAit, modalities for pain.  pre gait activities,  review exericises and complete level 1 knee execisies without increasedin pain   PT Home Exercise Plan GAit with two crutches and SLR, Quad set and RICE   Consulted and Agree with Plan of Care Patient        Problem List There are no active problems to display for this patient.   [name] 12/30/2015, 11:27 AM  [place], [place], [address] Phone: [phone]   Fax:  [phone]  Name: Tara Hartman MRN: [id number] Date of Birth: [birth year]   [name], PT 12/30/2015 11:27 AM Phone: [phone] Fax: [phone]

## 2016-01-04 ENCOUNTER — Ambulatory Visit: Payer: PRIVATE HEALTH INSURANCE | Attending: Orthopedic Surgery | Admitting: Physical Therapy

## 2016-01-04 DIAGNOSIS — M25562 Pain in left knee: Secondary | ICD-10-CM | POA: Diagnosis present

## 2016-01-04 DIAGNOSIS — R29898 Other symptoms and signs involving the musculoskeletal system: Secondary | ICD-10-CM | POA: Diagnosis present

## 2016-01-04 DIAGNOSIS — R6889 Other general symptoms and signs: Secondary | ICD-10-CM | POA: Insufficient documentation

## 2016-01-04 DIAGNOSIS — R262 Difficulty in walking, not elsewhere classified: Secondary | ICD-10-CM | POA: Diagnosis present

## 2016-01-04 DIAGNOSIS — R600 Localized edema: Secondary | ICD-10-CM | POA: Insufficient documentation

## 2016-01-04 DIAGNOSIS — M25662 Stiffness of left knee, not elsewhere classified: Secondary | ICD-10-CM

## 2016-01-04 NOTE — Therapy (Signed)
[place], [place], [address] Phone: [phone]   Fax:  [phone]  Physical Therapy Treatment  Patient Details  Name: Tara Hartman MRN: [id number] Date of Birth: 1981-03-03 Referring Provider: Dr. Kathalene Frames. Mayer Camel  Encounter Date: 01/04/2016      PT End of Session - 01/04/16 0839    Visit Number 4   Number of Visits 12   Date for PT Re-Evaluation 02/06/16   Authorization Type [employer]/ [employer]   PT Start Time [phone]   PT Stop Time 0915   PT Time Calculation (min) 47 min      No past medical history on file.  Past Surgical History  Procedure Laterality Date  . Appendectomy    . Tubal ligation      There were no vitals filed for this visit.  Visit Diagnosis:  Localized edema  Left knee pain  Decreased ROM of left knee  Activity intolerance  Difficulty walking  Difficulty walking up stairs      Subjective Assessment - 01/04/16 0830    Subjective I am a little better today.    Currently in Pain? Yes   Pain Score 5    Pain Location Knee   Pain Orientation Left;Anterior;Medial;Posterior   Pain Descriptors / Indicators Burning   Aggravating Factors  any movement of knee   Pain Relieving Factors RICE            [facility] PT Assessment - 01/04/16 0001    AROM   Left Knee Flexion 80  AAROM 102                     [facility] Adult PT Treatment/Exercise - 01/04/16 0840    Knee/Hip Exercises: Stretches   Other Knee/Hip Stretches ITB stretch with strap 3x30    Knee/Hip Exercises: Aerobic   Nustep Level 1 LE only x 2 minutes- increased pain so dc   Knee/Hip Exercises: Supine   Quad Sets Strengthening;Left;1 set;20 reps   Quad Sets Limitations VC for sustained contraction   Short Arc Quad Sets Left;1 set;20 reps   Short Arc Quad Sets Limitations heel prop and towel under knee   Heel Slides AAROM;Both;3 sets;10 reps;Other (comment)   Heel Slides Limitations strap    Straight Leg Raises Strengthening;Left;10 reps;1 set   Straight Leg Raises Limitations increased pain   Modalities   Modalities Cryotherapy   Cryotherapy   Number Minutes Cryotherapy --   Cryotherapy Location --   Type of Cryotherapy --   Iontophoresis   Type of Iontophoresis Dexamethasone   Location left knee at distal ITB and medial proximal gastroc   Dose 1 cc x2   Time to be removed after 4 hours and given Iontophoresis pt education   Manual Therapy   Manual Therapy Soft tissue mobilization;Other (comment)   Soft tissue mobilization Left lateral knee at ITB- very tender- does not tolerate well   Other Manual Therapy ice massage- to left distal ITB and proximal medial gastroc                  PT Short Term Goals - 01/04/16 0932    PT SHORT TERM GOAL #1   Title "Independent with initial HEP 01-16-16   Time 3   Period Weeks   Status Achieved   PT SHORT TERM GOAL #2   Title "Report pain decrease at rest from  7 /10 to  3 /10 ( 01-16-16)   Time 3  Period Weeks   Status On-going   PT SHORT TERM GOAL #3   Title "Demonstrate and verbalize understanding of condition management including RICE, positioning, use of A.D., HEP.  ( 01-16-16   Time 3   Period Weeks   Status Achieved   PT SHORT TERM GOAL #4   Title Pt will be able to ambulate with proper gait using two crutches ( 01-16-16   Time 3   Period Weeks   Status On-going           PT Long Term Goals - 12/28/15 0813    PT LONG TERM GOAL #1   Title "Pt will be independent with advanced HEP.    Status On-going   PT LONG TERM GOAL #2   Title "Pain will decrease to 1/10 with all functional activities   Status On-going   PT LONG TERM GOAL #3   Title "Pt will tolerate standing and walking for 2 hours without increased pain in order to return to PLOF   Status On-going   PT LONG TERM GOAL #4   Title "FOTO will improve from  77% liimitation   to  50% limitation   indicating improved functional mobility    Status  On-going   PT LONG TERM GOAL #5   Title Pt will be able to bend knee within 10 degrees of left (Left 140, Right is 72) in order to be able to perform transitional movements with greater ease    Status On-going   PT LONG TERM GOAL #6   Title Pt will be able to negotiate steps without exacerbation of pain   Status On-going               Plan - 01/04/16 0933    Clinical Impression Statement Pt continues to ambulate with one crutch and extended knee. Cues to flex knee as tolerated with gait. Pt reports 5/10 pain  Today and  increased lateral knee soreness after ultrasound last visit. Pt still very tender along distal ITB and medial proximal gastroc. Tape pllied to correct lateral tracking patella with pt reporting decreased pain during exercises after tape applied. Pt able to flex knee to 102 degrees AAROM with strap. Soft tissue work as tolerated to lateral ITB as well as ice massage and Ionto patches to both ITB and gastroc. Pt to remove in 4-6 hours. STG# 1,3, Met. Pt reports no pain upon walking after ice massage.    PT Next Visit Plan Assess benefit of ice and ionto, tape-continue. GAit, modalities for pain.  pre gait activities,  review exericises and complete level 1 knee execisies without increasedin pain        Problem List There are no active problems to display for this patient.   Dorene Ar, [place] 01/04/2016, 9:38 AM  [facility] [address] [place], [place], [address] Phone: [phone]   Fax:  [phone]  Name: Tara Hartman MRN: [id number] Date of Birth: 1981/09/10

## 2016-01-06 ENCOUNTER — Ambulatory Visit: Payer: PRIVATE HEALTH INSURANCE | Attending: Orthopedic Surgery | Admitting: Physical Therapy

## 2016-01-06 DIAGNOSIS — R29898 Other symptoms and signs involving the musculoskeletal system: Secondary | ICD-10-CM | POA: Insufficient documentation

## 2016-01-06 DIAGNOSIS — R6889 Other general symptoms and signs: Secondary | ICD-10-CM | POA: Insufficient documentation

## 2016-01-06 DIAGNOSIS — R600 Localized edema: Secondary | ICD-10-CM | POA: Diagnosis present

## 2016-01-06 DIAGNOSIS — M25562 Pain in left knee: Secondary | ICD-10-CM | POA: Diagnosis present

## 2016-01-06 DIAGNOSIS — M25662 Stiffness of left knee, not elsewhere classified: Secondary | ICD-10-CM

## 2016-01-06 DIAGNOSIS — R262 Difficulty in walking, not elsewhere classified: Secondary | ICD-10-CM | POA: Insufficient documentation

## 2016-01-06 NOTE — Patient Instructions (Signed)
Outer Hip Stretch: Reclined IT Band Stretch (Strap)    Strap around opposite foot, pull across only as far as possible with shoulders on mat. Hold for _5___ breaths. Repeat _3___ times left leg  Hamstring Step 2    Left foot relaxed, knee straight, other leg bent, foot flat. Raise straight leg further upward to maximal range. Hold __30_ seconds. Relax leg completely down. Repeat _3__ times.  Copyright  VHI. All rights reserved.   Gastroc / Heel Cord Stretch - Seated With Towel    Sit on floor, towel around ball of foot. Gently pull foot in toward body, stretching heel cord and calf. Hold for 30___ seconds. Repeat 3 times. 2 x per day

## 2016-01-06 NOTE — Therapy (Signed)
[place], [place], [address] Phone: [phone]   Fax:  [phone]  Physical Therapy Treatment  Patient Details  Name: Tara Hartman MRN: [id number] Date of Birth: [birth year] Referring Provider: Dr. Kathalene Frames. Mayer Camel  Encounter Date: 01/06/2016      PT End of Session - 01/06/16 0805    Visit Number 5   Number of Visits 12   Date for PT Re-Evaluation 02/06/16   Authorization Type [employer]/ [employer]   PT Start Time 0800   PT Stop Time [phone]   PT Time Calculation (min) 50 min      No past medical history on file.  Past Surgical History  Procedure Laterality Date  . Appendectomy    . Tubal ligation      There were no vitals filed for this visit.  Visit Diagnosis:  Localized edema  Left knee pain  Decreased ROM of left knee  Activity intolerance  Difficulty walking  Difficulty walking up stairs      Subjective Assessment - 01/06/16 0804    Subjective Look I did not bring my crutch. I am trying to not let the pain bother me.    Currently in Pain? Yes   Pain Score 5    Pain Orientation Medial   Pain Descriptors / Indicators Burning            [facility] PT Assessment - 01/06/16 0001    AROM   Left Knee Flexion 98                     [facility] Adult PT Treatment/Exercise - 01/06/16 0001    Knee/Hip Exercises: Stretches   Active Hamstring Stretch Left;3 reps;30 seconds   Active Hamstring Stretch Limitations strap   Gastroc Stretch Left;2 reps   Gastroc Stretch Limitations 10 sec x 3 supine with strap around ball of foot   Other Knee/Hip Stretches ITB stretch with strap 3x30    Knee/Hip Exercises: Seated   Long Arc Quad Left;10 reps   Long Arc Quad Limitations 5 sec , increased pain   Knee/Hip Exercises: Supine   Quad Sets Strengthening;Left;1 set;20 reps   Short Arc Quad Sets Left;1 set;20 reps   Short Arc Quad Sets Limitations over bolster   Heel Slides AROM    Heel Slides Limitations 98 degrees   Straight Leg Raises Strengthening;Left;10 reps;1 set   Cryotherapy   Number Minutes Cryotherapy 10 Minutes   Cryotherapy Location Knee   Type of Cryotherapy Ice pack   Manual Therapy   Manual Therapy Taping   Other Manual Therapy Kinesiotape to patell tendon   McConnell Lateral tracking tape left                PT Education - 01/06/16 0845    Education provided Yes   Education Details hamstring, gastroc and ITB stretch   Person(s) Educated Patient   Methods Explanation;Handout   Comprehension Verbalized understanding          PT Short Term Goals - 01/06/16 0806    PT SHORT TERM GOAL #1   Title "Independent with initial HEP 01-16-16   Time 3   Period Weeks   Status Achieved   PT SHORT TERM GOAL #2   Title "Report pain decrease at rest from  7 /10 to  3 /10 ( 01-16-16)   Baseline 3/10 at rest except at night, increases to 10/10 every night   Time 3   Period Weeks  Status Partially Met   PT SHORT TERM GOAL #3   Title "Demonstrate and verbalize understanding of condition management including RICE, positioning, use of A.D., HEP.  ( 01-16-16   Time 3   Period Weeks   Status Achieved   PT SHORT TERM GOAL #4   Title Pt will be able to ambulate with proper gait using two crutches ( 01-16-16   Time 3   Period Weeks   Status On-going           PT Long Term Goals - 12/28/15 7591    PT LONG TERM GOAL #1   Title "Pt will be independent with advanced HEP.    Status On-going   PT LONG TERM GOAL #2   Title "Pain will decrease to 1/10 with all functional activities   Status On-going   PT LONG TERM GOAL #3   Title "Pt will tolerate standing and walking for 2 hours without increased pain in order to return to PLOF   Status On-going   PT LONG TERM GOAL #4   Title "FOTO will improve from  77% liimitation   to  50% limitation   indicating improved functional mobility    Status On-going   PT LONG TERM GOAL #5   Title Pt will be  able to bend knee within 10 degrees of left (Left 140, Right is 72) in order to be able to perform transitional movements with greater ease    Status On-going   PT LONG TERM GOAL #6   Title Pt will be able to negotiate steps without exacerbation of pain   Status On-going               Plan - 01/06/16 0849    Clinical Impression Statement Pt presents without crutch and is trying to work through the pain. She continues to ambulate with extensed knee. Cues to normalize gait. Recommended return to crutch to assist with normalized gait. Applied mcconnels tape as previous due to pt reporting improvement while applied. Also used kinesiotape I strip to patella tendon due to tenderness with palpation. Pt demonstrates increased tolerance to therex today and increased AROM for knee flexion. Issued stretches for HEP. Continued difficulty and most pain with extension ROM. She reports decreased pain to 3/10 at rest except with sleep can be 10/10. STG#2 partially MET.  Pt reports aching after ionto patches and unsure if helpful. Pt reports increased discomfort after todays session.    PT Next Visit Plan continue knee flexibility and progressive therex as tolerated, contiue tape if helpful, gait         Problem List There are no active problems to display for this patient.   Dorene Ar , [place]  01/06/2016, 9:35 AM  [facility] [address] [place], [place], [address] Phone: [phone]   Fax:  [phone]  Name: Tara Hartman MRN: [id number] Date of Birth: 21-Sep-1981

## 2016-01-11 ENCOUNTER — Ambulatory Visit: Payer: PRIVATE HEALTH INSURANCE | Attending: Orthopedic Surgery | Admitting: Physical Therapy

## 2016-01-11 DIAGNOSIS — R600 Localized edema: Secondary | ICD-10-CM | POA: Diagnosis not present

## 2016-01-11 DIAGNOSIS — R6889 Other general symptoms and signs: Secondary | ICD-10-CM | POA: Insufficient documentation

## 2016-01-11 DIAGNOSIS — R262 Difficulty in walking, not elsewhere classified: Secondary | ICD-10-CM | POA: Diagnosis present

## 2016-01-11 DIAGNOSIS — M25562 Pain in left knee: Secondary | ICD-10-CM | POA: Insufficient documentation

## 2016-01-11 DIAGNOSIS — M25662 Stiffness of left knee, not elsewhere classified: Secondary | ICD-10-CM

## 2016-01-11 DIAGNOSIS — R29898 Other symptoms and signs involving the musculoskeletal system: Secondary | ICD-10-CM | POA: Insufficient documentation

## 2016-01-11 NOTE — Therapy (Signed)
[place], [place], [address] Phone: [phone]   Fax:  [phone]  Physical Therapy Treatment  Patient Details  Name: Tara Hartman MRN: [id number] Date of Birth: 22-Aug-1981 Referring Provider: Dr. Kathalene Frames. Mayer Camel  Encounter Date: 01/11/2016      PT End of Session - 01/11/16 1655    Visit Number 6   Number of Visits 12   Date for PT Re-Evaluation 02/06/16   PT Start Time 0845   PT Stop Time 0930   PT Time Calculation (min) 45 min      No past medical history on file.  Past Surgical History  Procedure Laterality Date  . Appendectomy    . Tubal ligation      There were no vitals filed for this visit.  Visit Diagnosis:  Localized edema  Left knee pain  Decreased ROM of left knee  Activity intolerance  Difficulty walking  Difficulty walking up stairs      Subjective Assessment - 01/11/16 0853    Subjective I went to the MD yesterday. I go back in 2 weeks and if no better, I may be put on disability.    Currently in Pain? Yes   Pain Score 8    Pain Location Knee   Aggravating Factors  trying to sleep   Pain Relieving Factors RICE            [facility] PT Assessment - 01/11/16 0001    AROM   Left Knee Flexion 85                     [facility] Adult PT Treatment/Exercise - 01/11/16 0001    Knee/Hip Exercises: Stretches   Active Hamstring Stretch Left;3 reps;30 seconds   Active Hamstring Stretch Limitations strap   Knee/Hip Exercises: Supine   Quad Sets Strengthening;Left;1 set;20 reps   Short Arc Quad Sets Left;1 set;20 reps   Short Arc Quad Sets Limitations over bolster   Heel Slides AROM   Heel Slides Limitations 85 degrees    Modalities   Modalities Vasopneumatic low pressure 32 degrees x 15 min    l                  PT Short Term Goals - 01/06/16 0806    PT SHORT TERM GOAL #1   Title "Independent with initial HEP 01-16-16   Time 3   Period Weeks   Status Achieved   PT SHORT TERM GOAL #2   Title "Report pain decrease at rest from  7 /10 to  3 /10 ( 01-16-16)   Baseline 3/10 at rest except at night, increases to 10/10 every night   Time 3   Period Weeks   Status Partially Met   PT SHORT TERM GOAL #3   Title "Demonstrate and verbalize understanding of condition management including RICE, positioning, use of A.D., HEP.  ( 01-16-16   Time 3   Period Weeks   Status Achieved   PT SHORT TERM GOAL #4   Title Pt will be able to ambulate with proper gait using two crutches ( 01-16-16   Time 3   Period Weeks   Status On-going           PT Long Term Goals - 12/28/15 3748    PT LONG TERM GOAL #1   Title "Pt will be independent with advanced HEP.    Status On-going   PT LONG TERM GOAL #2   Title "Pain  will decrease to 1/10 with all functional activities   Status On-going   PT LONG TERM GOAL #3   Title "Pt will tolerate standing and walking for 2 hours without increased pain in order to return to PLOF   Status On-going   PT LONG TERM GOAL #4   Title "FOTO will improve from  77% liimitation   to  50% limitation   indicating improved functional mobility    Status On-going   PT LONG TERM GOAL #5   Title Pt will be able to bend knee within 10 degrees of left (Left 140, Right is 72) in order to be able to perform transitional movements with greater ease    Status On-going   PT LONG TERM GOAL #6   Title Pt will be able to negotiate steps without exacerbation of pain   Status On-going               Plan - 01/11/16 0857    Clinical Impression Statement Pt presents with frustration about her situation. MD does not know why she is still in pain. She received cortisone injection yesterday without benefit thus far. She demonstrates quad tighntess and does not tolerate manual soft tissue work. She is receptive to trying dry needling. I have schedule her next visit to try dry needling to quads. Gentle therex today closely monitoring pain  response. Used vaso for pain/edema management. Pt reports decreased pain post vaso.   PT Next Visit Plan Dry needling; continue knee flexibility and progressive therex as tolerated, contiue tape if helpful, gait         Problem List There are no active problems to display for this patient.   Dorene Ar, [place] 01/11/2016, 9:58 AM  [facility] [place], [place], [address] Phone: [phone]   Fax:  [phone]  Name: Tara Hartman MRN: [id number] Date of Birth: 01/14/1981

## 2016-01-13 ENCOUNTER — Ambulatory Visit: Payer: PRIVATE HEALTH INSURANCE | Admitting: Physical Therapy

## 2016-01-13 DIAGNOSIS — M25562 Pain in left knee: Secondary | ICD-10-CM

## 2016-01-13 DIAGNOSIS — R6889 Other general symptoms and signs: Secondary | ICD-10-CM

## 2016-01-13 DIAGNOSIS — R262 Difficulty in walking, not elsewhere classified: Secondary | ICD-10-CM

## 2016-01-13 DIAGNOSIS — R600 Localized edema: Secondary | ICD-10-CM

## 2016-01-13 DIAGNOSIS — M25662 Stiffness of left knee, not elsewhere classified: Secondary | ICD-10-CM

## 2016-01-13 NOTE — Therapy (Signed)
[facility] [address] [place], [place], [address] Phone: [phone]   Fax:  [phone]  Physical Therapy Treatment  Patient Details  Name: Tara Hartman MRN: [id number] Date of Birth: 01/12/1981 Referring Provider: Dr. Frank J. Rowan  Encounter Date: 01/13/2016      PT End of Session - 01/13/16 1212    Visit Number 7   Number of Visits 12   Date for PT Re-Evaluation 02/06/16   Authorization Type [place]/ [facility]   PT Start Time 0930   PT Stop Time 1020   PT Time Calculation (min) 50 min   Activity Tolerance Patient tolerated treatment well   Behavior During Therapy WFL for tasks assessed/performed      No past medical history on file.  Past Surgical History  Procedure Laterality Date  . Appendectomy    . Tubal ligation      There were no vitals filed for this visit.  Visit Diagnosis:  Localized edema  Left knee pain  Decreased ROM of left knee  Activity intolerance  Difficulty walking  Difficulty walking up stairs      Subjective Assessment - 01/13/16 0931    Subjective (p) " I am stil having alito of pain in the knee"   Currently in Pain? (p) Yes   Pain Score (p) 8    Pain Location (p) Knee   Pain Orientation (p) Left;Medial   Pain Descriptors / Indicators (p) Aching   Pain Onset (p) More than a month ago   Pain Frequency (p) Constant                         [facility] Adult PT Treatment/Exercise - 01/13/16 0001    Self-Care   Self-Care Other Self-Care Comments   Other Self-Care Comments  anatomy of the knee and possible involved structures   Modalities   Modalities Moist Heat   Moist Heat Therapy   Number Minutes Moist Heat 10 Minutes   Moist Heat Location Knee   Manual Therapy   Manual Therapy Myofascial release   Soft tissue mobilization gental STM over rectus femoris and vastus lateralis   Myofascial Release gentle rolling and myofascial stretching of the  distal quad           Trigger Point Dry Needling - 01/13/16 1245    Consent Given? Yes   Education Handout Provided Yes   Muscles Treated Lower Body Quadriceps  vastus lateralis/ rectus femoris   Quadriceps Response Twitch response elicited;Palpable increased muscle length              PT Education - 01/13/16 1211    Education provided Yes   Education Details dry needling education, anatomy of the knee   Person(s) Educated Patient   Methods Explanation   Comprehension Verbalized understanding          PT Short Term Goals - 01/06/16 0806    PT SHORT TERM GOAL #1   Title "Independent with initial HEP 01-16-16   Time 3   Period Weeks   Status Achieved   PT SHORT TERM GOAL #2   Title "Report pain decrease at rest from  7 /10 to  3 /10 ( 01-16-16)   Baseline 3/10 at rest except at night, increases to 10/10 every night   Time 3   Period Weeks   Status Partially Met   PT SHORT TERM GOAL #3   Title "Demonstrate and verbalize understanding of condition management including   RICE, positioning, use of A.D., HEP.  ( 01-16-16   Time 3   Period Weeks   Status Achieved   PT SHORT TERM GOAL #4   Title Pt will be able to ambulate with proper gait using two crutches ( 01-16-16   Time 3   Period Weeks   Status On-going           PT Long Term Goals - 12/28/15 4132    PT LONG TERM GOAL #1   Title "Pt will be independent with advanced HEP.    Status On-going   PT LONG TERM GOAL #2   Title "Pain will decrease to 1/10 with all functional activities   Status On-going   PT LONG TERM GOAL #3   Title "Pt will tolerate standing and walking for 2 hours without increased pain in order to return to PLOF   Status On-going   PT LONG TERM GOAL #4   Title "FOTO will improve from  77% liimitation   to  50% limitation   indicating improved functional mobility    Status On-going   PT LONG TERM GOAL #5   Title Pt will be able to bend knee within 10 degrees of left (Left 140, Right is 72)  in order to be able to perform transitional movements with greater ease    Status On-going   PT LONG TERM GOAL #6   Title Pt will be able to negotiate steps without exacerbation of pain   Status On-going               Plan - 01/13/16 1212    Clinical Impression Statement Tara Hartman reports that she continues to report increase frustration and pain to be 8/10 focused at the medial patella and proximal to the superior aspect of the patella.  upon further assessment she demonstrates decreased light touch dermatomal sensations in L1-S4 in the L leg compared bil, and hx or intermittent buckling of the knee. She maybe experiencing neurological involvment if nothing seems to benefit she may benefit from further assessment by a nuerologist.  she provided conset for dry needling of the vastus lateralis and rectus femoris which she reported sorenss and multiple twitches. Performed light STM over the distal quads and knee to decrease pain. utilized heat to facilitate pain relief.    PT Next Visit Plan assess response to Dry needling; continue knee flexibility and progressive therex as tolerated, contiue tape if helpful, gait, try MHP   Consulted and Agree with Plan of Care Patient        Problem List There are no active problems to display for this patient.  Tara Hartman PT, DPT, LAT, ATC  01/13/2016  12:45 PM     [facility] [address] [place], [place], [address] Phone: [phone]   Fax:  [phone]  Name: Tara Hartman MRN: [id number] Date of Birth: [birth year]

## 2016-01-16 ENCOUNTER — Encounter (HOSPITAL_COMMUNITY): Payer: Self-pay | Admitting: *Deleted

## 2016-01-16 ENCOUNTER — Emergency Department (HOSPITAL_COMMUNITY)
Admission: EM | Admit: 2016-01-16 | Discharge: 2016-01-16 | Disposition: A | Discharge status: Home / Self Care | Payer: Self-pay | Attending: Emergency Medicine | Admitting: Emergency Medicine

## 2016-01-16 DIAGNOSIS — Z87828 Personal history of other (healed) physical injury and trauma: Secondary | ICD-10-CM | POA: Insufficient documentation

## 2016-01-16 DIAGNOSIS — R200 Anesthesia of skin: Secondary | ICD-10-CM | POA: Insufficient documentation

## 2016-01-16 DIAGNOSIS — Z79899 Other long term (current) drug therapy: Secondary | ICD-10-CM | POA: Insufficient documentation

## 2016-01-16 DIAGNOSIS — G8929 Other chronic pain: Secondary | ICD-10-CM | POA: Insufficient documentation

## 2016-01-16 DIAGNOSIS — M25562 Pain in left knee: Secondary | ICD-10-CM | POA: Insufficient documentation

## 2016-01-16 DIAGNOSIS — G479 Sleep disorder, unspecified: Secondary | ICD-10-CM | POA: Insufficient documentation

## 2016-01-16 MED ORDER — KETOROLAC TROMETHAMINE 30 MG/ML IJ SOLN
30.0000 mg | Freq: Once | INTRAMUSCULAR | Status: AC
  Administered 2016-01-16: 30 mg via INTRAMUSCULAR
  Filled 2016-01-16: qty 1

## 2016-01-16 MED ORDER — OXYCODONE-ACETAMINOPHEN 5-325 MG PO TABS
1.0000 | ORAL_TABLET | Freq: Once | ORAL | Status: AC
  Administered 2016-01-16: 1 via ORAL

## 2016-01-16 MED ORDER — PREDNISONE 20 MG PO TABS
60.0000 mg | ORAL_TABLET | Freq: Once | ORAL | Status: AC
  Administered 2016-01-16: 60 mg via ORAL
  Filled 2016-01-16: qty 3

## 2016-01-16 MED ORDER — IBUPROFEN 800 MG PO TABS: 800.0000 mg | ORAL_TABLET | Freq: Three times a day (TID) | ORAL | Status: DC

## 2016-01-16 MED ORDER — HYDROCODONE-ACETAMINOPHEN 5-325 MG PO TABS: 1.0000 | ORAL_TABLET | ORAL | Status: DC | PRN

## 2016-01-16 MED ORDER — OXYCODONE-ACETAMINOPHEN 5-325 MG PO TABS
ORAL_TABLET | ORAL | Status: AC
  Filled 2016-01-16: qty 1

## 2016-01-16 MED FILL — traMADol HCL 50 MG TABS: 50 | 10 days supply | Qty: 30 | Fill #0

## 2016-01-16 MED FILL — HYDROCODON-APAP 5-325: 5-325 | 2 days supply | Qty: 10 | Fill #0

## 2016-01-16 MED FILL — IBUPROFEN 800 MG TABLET: 800 | 7 days supply | Qty: 21 | Fill #0

## 2016-01-16 NOTE — ED Provider Notes (Signed)
CSN: [id number]     Arrival date & time 01/16/16  1128 History  By signing my name below, I, Linna Darner, attest that this documentation has been prepared under the direction and in the presence of non-physician practitioner, Noelle Penner, PA-C. Electronically Signed: Linna Darner, Scribe. 01/16/2016. 1:18 PM.     Chief Complaint  Patient presents with  . Knee Injury     The history is provided by the patient. No language interpreter was used.     HPI Comments: Sherri Mcarthy is a 35 y.o. female who presents to the Emergency Department complaining of gradual onset, worsening, left knee pain for the past two months. She states that her pain is at its worst today. She also reports that the pain is due to her job and is reducing her ability to work. Pt endorses difficulty ambulating; she occasionally uses crutches for support and wears a knee brace currently. Pt has been to physical therapy and went to see an orthopedic specialist, Dr. Carlean Jews, in [place] recently; she reports that she had an MRI which came back clear. Her next appointment with Dr. Carlean Jews is the 23rd of this month. She also had an acupuncture procedure performed three days ago and reports that she still feels numbness in her left thigh. Pt states that she has been taking 800 mg ibuprofen daily with no relief. Pt denies any new injury to the area. Pt endorses difficulty sleeping due to her knee pain; she reports that she has not slept for the last four days.She denies shortness of breath, chest pain, or any other associated symptoms at this time. She has a knee sleeve and crutches.     History reviewed. No pertinent past medical history. Past Surgical History  Procedure Laterality Date  . Appendectomy    . Tubal ligation     Family History  Problem Relation Age of Onset  . Diabetes Mother    Social History  Substance Use Topics  . Smoking status: Never Smoker   . Smokeless tobacco: None  . Alcohol Use: Yes   Comment: occ   OB History    No data available     Review of Systems  Musculoskeletal: Positive for arthralgias (left knee pain).  Neurological: Positive for numbness (left leg s/p acupuncture).  Psychiatric/Behavioral: Positive for sleep disturbance (difficulty sleeping due to knee pain).  All other systems reviewed and are negative.     Allergies  Pork-derived products  Home Medications   Prior to Admission medications   Medication Sig Start Date End Date Taking? Authorizing Provider  albuterol (PROVENTIL HFA;VENTOLIN HFA) 108 (90 BASE) MCG/ACT inhaler Inhale 1 puff into the lungs every 6 (six) hours as needed for wheezing or shortness of breath. 09/04/15   Elson Areas, PA-C  aspirin-acetaminophen-caffeine (EXCEDRIN MIGRAINE) [phone] MG per tablet Take 2 tablets by mouth every 6 (six) hours as needed for headache or migraine.    Historical Provider, MD  azithromycin (ZITHROMAX) 250 MG tablet Take 1 tablet (250 mg total) by mouth daily. Take first 2 tablets together, then 1 every day until finished. Patient not taking: Reported on 11/15/2015 09/04/15   Elson Areas, PA-C  HYDROcodone-acetaminophen (NORCO/VICODIN) 5-325 MG tablet Take 2 tablets by mouth every 4 (four) hours as needed. 11/28/15   Tharon Aquas, PA  ibuprofen (ADVIL,MOTRIN) 600 MG tablet Take 1 tablet (600 mg total) by mouth every 6 (six) hours as needed. Patient not taking: Reported on 11/15/2015 03/12/14   Derwood Kaplan, MD   BP  121/89 mmHg  Pulse 80  Temp(Src) 98.6 F (37 C) (Oral)  Resp 20  SpO2 99%  LMP 01/10/2016 Physical Exam  Constitutional: She appears well-developed and well-nourished. No distress.  HENT:  Head: Normocephalic and atraumatic.  Right Ear: External ear normal.  Left Ear: External ear normal.  Eyes: Conjunctivae are normal. Right eye exhibits no discharge. Left eye exhibits no discharge. No scleral icterus.  Neck: Neck supple. No tracheal deviation present.  Cardiovascular:  Normal rate, regular rhythm and intact distal pulses.   Pulmonary/Chest: Effort normal and breath sounds normal. No stridor. No respiratory distress. She has no wheezes. She has no rales.  Abdominal: Soft. Bowel sounds are normal. She exhibits no distension. There is no tenderness. There is no rebound and no guarding.  Musculoskeletal:  Left knee is diffusely TTP with guarding. Particular tenderness at lateral and medial edge and suprapatellar region. No effusion palpable. No discoloration. FROM though guarded due to pain. No laxity. Intact distal sensation.  Neurological: She is alert. She has normal strength. No cranial nerve deficit (no facial droop, extraocular movements intact, no slurred speech) or sensory deficit. She exhibits normal muscle tone. She displays no seizure activity. Coordination normal.  Skin: Skin is warm and dry. No rash noted.  Psychiatric: She has a normal mood and affect.  Nursing note and vitals reviewed.   ED Course  Procedures (including critical care time)  DIAGNOSTIC STUDIES: Oxygen Saturation is 99% on RA, normal by my interpretation.    COORDINATION OF CARE:  1:19 PM Will administer Toradol and Deltasone. Discussed treatment plan with pt at bedside and pt agreed to plan.   Labs Review Labs Reviewed - No data to display  Imaging Review No results found.    EKG Interpretation None      MDM   Final diagnoses:  Chronic knee pain, left    Pt is an 35 y.o. female with two months of knee pain after injury at work (works here at [employer]). She follows with ortho and PT regularly, has had negative x-ray and MRI. She has not been given any precriptions for pain medicine. Percocet, toradol, and prednisone given in the ED. Will give rx for few days of vicodin and discussed with pt she may take ibuprofen  three times daily. However, she should follow up with ortho for ongoing management of pain. I discussed with pt there is no indication for further workup  or imaging of pt's knee in the ED today given no history of new trauma/injury, no new exam findings. She is in agreement with the plan.   I personally performed the services described in this documentation, which was scribed in my presence. The recorded information has been reviewed and is accurate.     Carlene Coria, PA-C 01/16/16 1403  Azalia Bilis, MD 01/17/16 [phone]

## 2016-01-16 NOTE — Discharge Instructions (Signed)
You were seen in the ER today for left knee pain. Your pain is chronic. We do not need to do any new x-rays or MRI today. Please call Dr. Wadie Lessen office to try to move your appointment sooner for follow up. Please continue with physical therapy as scheduled. In the meantime I will give you a few days of Vicodin (a strong painkiller). You may also take ibuprofen  THREE times daily.   Return to the emergency room for worsening condition or new concerning symptoms. Follow up with your regular doctor. If you don't have a regular doctor use one of the numbers below to establish a primary care doctor.   Emergency Department Resource Guide 1) Find a Doctor and Pay Out of Pocket Although you won't have to find out who is covered by your insurance plan, it is a good idea to ask around and get recommendations. You will then need to call the office and see if the doctor you have chosen will accept you as a new patient and what types of options they offer for patients who are self-pay. Some doctors offer discounts or will set up payment plans for their patients who do not have insurance, but you will need to ask so you aren't surprised when you get to your appointment.  2) Contact Your Local Health Department Not all health departments have doctors that can see patients for sick visits, but many do, so it is worth a call to see if yours does. If you don't know where your local health department is, you can check in your phone book. The CDC also has a tool to help you locate your state's health department, and many state websites also have listings of all of their local health departments.  3) Find a Walk-in Clinic If your illness is not likely to be very severe or complicated, you may want to try a walk in clinic. These are popping up all over the country in pharmacies, drugstores, and shopping centers. They're usually staffed by nurse practitioners or physician assistants that have been trained to treat  common illnesses and complaints. They're usually fairly quick and inexpensive. However, if you have serious medical issues or chronic medical problems, these are probably not your best option.  No Primary Care Doctor: - Call Health Connect at  [phone] - they can help you locate a primary care doctor that  accepts your insurance, provides certain services, etc. - Physician Referral [phone]  Emergency Department Resource Guide 1) Find a Doctor and Pay Out of Pocket Although you won't have to find out who is covered by your insurance plan, it is a good idea to ask around and get recommendations. You will then need to call the office and see if the doctor you have chosen will accept you as a new patient and what types of options they offer for patients who are self-pay. Some doctors offer discounts or will set up payment plans for their patients who do not have insurance, but you will need to ask so you aren't surprised when you get to your appointment.  2) Contact Your Local Health Department Not all health departments have doctors that can see patients for sick visits, but many do, so it is worth a call to see if yours does. If you don't know where your local health department is, you can check in your phone book. The CDC also has a tool to help you locate your state's health department, and many state websites also have  listings of all of their local health departments.  3) Find a Walk-in Clinic If your illness is not likely to be very severe or complicated, you may want to try a walk in clinic. These are popping up all over the country in pharmacies, drugstores, and shopping centers. They're usually staffed by nurse practitioners or physician assistants that have been trained to treat common illnesses and complaints. They're usually fairly quick and inexpensive. However, if you have serious medical issues or chronic medical problems, these are probably not your best option.  No  Primary Care Doctor: - Call Health Connect at  [phone] - they can help you locate a primary care doctor that  accepts your insurance, provides certain services, etc. - Physician Referral Service- [phone]  Chronic Pain Problems: Organization         Address  Phone   Notes  [facility]  [phone] Patients need to be referred by their primary care doctor.   Medication Assistance: Organization         Address  Phone   Notes  [facility] Medication [facility] [address] [place] [place], [place] [address] [phone] --Must be a resident of [facility] -- Must have NO insurance coverage whatsoever (no Medicaid/ Medicare, etc.) -- The pt. MUST have a primary care doctor that directs their care regularly and follows them in the community   [facility]  [phone]   [employer]  [phone]    Agencies that provide inexpensive medical care: Organization         Address  Phone   Notes  [facility]  [phone]   [facility]    [phone]   [facility] [address] [place], [place] [address] [phone]   [facility] [address], [place] [phone]   [facility]    [phone]   [facility]    [phone]   [facility]  [address], [place] Phone:  [phone], Fax:  [phone] Hours of Operation:  9 am - 6 pm, M-F.  Also accepts Medicaid/Medicare and self-pay.  [facility]  [address], [place] Phone: [phone], Fax: [phone]. Hours of Operation:  8:30 am - 5:30 pm, M-F.  Also accepts Medicaid and self-pay.  [facility] [address] [place] [address], [place] Phone: [phone]   [facility] [address] Natasha Bence [place], [place] [phone], Ext. [phone] Mondays & Thursdays: 7-9 AM.  First 15 patients are seen on  a first come, first serve basis.    Medicaid-accepting [facility] Providers:  Organization         Address  Phone   Notes  [facility] [address],  [phone] Also accepts self-pay patients.  [facility] [address] Laurell Josephs [place], [place]  [phone]   [facility] [address], [place] [phone]   [facility] [address] [place] [phone]   Renaye Rakers [address], [place]   [phone] Only accepts [place] patients after they have their name applied to their card.   Self-Pay (no insurance) in [facility]:  [employer]  Notes  Sickle Cell Patients, [place] [phone]   [facility] [place] [phone]   [facility] [place]  [place], [place], [place] [phone]   [facility]/Dr. Osei-Bonsu  [address] [place] or [address], [place] [phone] Phone number for both [place] and [place] locations is the same.  [facility] [address], [place] [phone]   [facility] [address] [place] or [address] [phone]   [facility] [address], [place] [phone], phone; [phone], fax Sees patients 1st and 3rd Saturday of every month.  Must not qualify for public or private insurance (i.e. Medicaid, Medicare,  Health Choice, Veterans' Benefits)  Household income should be no more than 200% of the poverty level The clinic cannot treat you if you are pregnant or think you are pregnant  Sexually transmitted diseases are not treated at the clinic.

## 2016-01-16 NOTE — ED Notes (Signed)
Pt is here with left knee injury for last 2 months.  Pt has had injection to left knee. Pt states that pain has increased over the last 4 days. PT took Ibuprofen this am.

## 2016-01-16 NOTE — ED Notes (Signed)
Pt refuses knee immobilizer.

## 2016-01-16 NOTE — ED Notes (Signed)
Pt ambulates with crutches and with steady gait at time of discharge. Discharge instructions and follow up information reviewed with patient. No other questions or concerns voiced at this time. RX x 2 given.

## 2016-01-18 ENCOUNTER — Ambulatory Visit: Payer: PRIVATE HEALTH INSURANCE | Admitting: Physical Therapy

## 2016-01-18 DIAGNOSIS — R600 Localized edema: Secondary | ICD-10-CM | POA: Diagnosis not present

## 2016-01-18 DIAGNOSIS — M25662 Stiffness of left knee, not elsewhere classified: Secondary | ICD-10-CM

## 2016-01-18 DIAGNOSIS — R262 Difficulty in walking, not elsewhere classified: Secondary | ICD-10-CM

## 2016-01-18 DIAGNOSIS — M25562 Pain in left knee: Secondary | ICD-10-CM

## 2016-01-18 DIAGNOSIS — R6889 Other general symptoms and signs: Secondary | ICD-10-CM

## 2016-01-18 NOTE — Therapy (Signed)
[facility] [address] [place], [place], [address] Phone: [phone]   Fax:  [phone]  Physical Therapy Treatment  Patient Details  Name: Tara Hartman MRN: [id number] Date of Birth: 05-03-1981 Referring Provider: Dr. Feliberto Gottron. Turner Daniels  Encounter Date: 01/18/2016      PT End of Session - 01/18/16 0925    Visit Number 8   Number of Visits 12   PT Start Time 0843   PT Stop Time 0945   PT Time Calculation (min) 62 min   Activity Tolerance Patient tolerated treatment well   Behavior During Therapy [facility] for tasks assessed/performed      No past medical history on file.  Past Surgical History  Procedure Laterality Date  . Appendectomy    . Tubal ligation      There were no vitals filed for this visit.  Visit Diagnosis:  Localized edema  Left knee pain  Decreased ROM of left knee  Activity intolerance  Difficulty walking  Difficulty walking up stairs      Subjective Assessment - 01/18/16 0846    Subjective Had to go to the ED the other day.  Pain is worse than when I started.  Swollen in knee and ankle.  Sees MD tomorrow (Dr. Turner Daniels)   Currently in Pain? Yes   Pain Score 8    Pain Location Knee   Pain Orientation Left   Pain Descriptors / Indicators Burning   Pain Type Chronic pain   Pain Onset More than a month ago   Pain Frequency Constant   Aggravating Factors  bending knee, sometimes pain is random   Pain Relieving Factors pain meds only work to let her sleep, but no significant pain relief            [facility] PT Assessment - 01/18/16 0911    Sensation   Light Touch Impaired Detail   Light Touch Impaired Details Impaired LLE  absent medial thigh (vastus medialis) and medial lower leg   Hot/Cold Impaired by gross assessment  feels less heat/cold lateral L thigh and medial knee    Proprioception Impaired Detail   Proprioception Impaired Details Impaired LLE   Additional Comments big toe L impaired,  ankle    PROM   Overall PROM  --  PROM of ankle and toes increased knee and hip pain              [facility] Adult PT Treatment/Exercise - 01/18/16 0933    Cryotherapy   Number Minutes Cryotherapy 15 Minutes   Cryotherapy Location Knee  extra layers for protection   Type of Cryotherapy Ice pack   Electrical Stimulation   Electrical Stimulation Location L knee    Electrical Stimulation Action IFC   Electrical Stimulation Parameters 13  did not apply to medial aspect of knee    Electrical Stimulation Goals Edema;Pain            PT Education - 01/18/16 [phone]    Education provided Yes   Education Details sensation, nerve distribution   Person(s) Educated Patient   Methods Explanation   Comprehension Verbalized understanding          PT Short Term Goals - 01/18/16 0929    PT SHORT TERM GOAL #1   Title "Independent with initial HEP 01-16-16   Status Achieved   PT SHORT TERM GOAL #2   Title "Report pain decrease at rest from  7 /10 to  3 /10 ( 01-16-16)   Status On-going  PT SHORT TERM GOAL #3   Title "Demonstrate and verbalize understanding of condition management including RICE, positioning, use of A.D., HEP.  ( 01-16-16   Status Achieved   PT SHORT TERM GOAL #4   Title Pt will be able to ambulate with proper gait using two crutches ( 01-16-16   Status Achieved           PT Long Term Goals - 01/18/16 0929    PT LONG TERM GOAL #1   Title "Pt will be independent with advanced HEP.    Status On-going   PT LONG TERM GOAL #2   Title "Pain will decrease to 1/10 with all functional activities   Status On-going   PT LONG TERM GOAL #3   Title "Pt will tolerate standing and walking for 2 hours without increased pain in order to return to PLOF   Status On-going   PT LONG TERM GOAL #4   Title "FOTO will improve from  77% liimitation   to  50% limitation   indicating improved functional mobility    Status On-going   PT LONG TERM GOAL #5   Title Pt will be able to bend  knee within 10 degrees of left (Left 140, Right is 72) in order to be able to perform transitional movements with greater ease    Status On-going   PT LONG TERM GOAL #6   Title Pt will be able to negotiate steps without exacerbation of pain   Status On-going               Plan - 01/18/16 0925    Clinical Impression Statement Patient went to ED recently, has had no real relief with pain meds.  Assessed sensation today, including deep pressure and proprioception, both impaired in LLE. She did endorse intermittent Lumbosacral pain since the incident.  No red flags.  Considering worsening  symptoms and neurological involvement may benefit from Neurology referral.    PT Next Visit Plan Hold PT until after she sees Dr. Turner Daniels for diagnostic testing.  Has 4 visits to use.    PT Home Exercise Plan None new, is I         Problem List There are no active problems to display for this patient.   Darrnell Mangiaracina 01/18/2016, 9:35 AM  [facility] [address] [place], [place], [address] Phone: [phone]   Fax:  [phone]  Name: Akari Defelice MRN: [id number] Date of Birth: 05-26-1981   Karie Mainland, PT 01/18/2016 9:35 AM Phone: [phone] Fax: [phone]

## 2016-01-20 ENCOUNTER — Ambulatory Visit: Payer: PRIVATE HEALTH INSURANCE | Attending: Orthopedic Surgery | Admitting: Physical Therapy

## 2016-01-20 DIAGNOSIS — M25562 Pain in left knee: Secondary | ICD-10-CM

## 2016-01-20 DIAGNOSIS — R6889 Other general symptoms and signs: Secondary | ICD-10-CM | POA: Diagnosis present

## 2016-01-20 DIAGNOSIS — M25662 Stiffness of left knee, not elsewhere classified: Secondary | ICD-10-CM

## 2016-01-20 DIAGNOSIS — R262 Difficulty in walking, not elsewhere classified: Secondary | ICD-10-CM | POA: Diagnosis present

## 2016-01-20 DIAGNOSIS — R29898 Other symptoms and signs involving the musculoskeletal system: Secondary | ICD-10-CM | POA: Diagnosis present

## 2016-01-20 DIAGNOSIS — R600 Localized edema: Secondary | ICD-10-CM

## 2016-01-20 NOTE — Therapy (Addendum)
[place], [place], [address] Phone: [phone]   Fax:  [phone]  Physical Therapy Treatment  Patient Details  Name: Tara Hartman MRN: [id number] Date of Birth: 03/19/1981 Referring Provider: Dr. Kathalene Frames. Mayer Camel  Encounter Date: 01/20/2016      PT End of Session - 01/20/16 0808    Visit Number 9   Number of Visits 12   Date for PT Re-Evaluation 02/06/16   Authorization Type [place]   PT Start Time [phone]   PT Stop Time 0845   PT Time Calculation (min) 40 min      No past medical history on file.  Past Surgical History  Procedure Laterality Date  . Appendectomy    . Tubal ligation      There were no vitals filed for this visit.  Visit Diagnosis:  Localized edema  Left knee pain  Decreased ROM of left knee  Activity intolerance  Difficulty walking  Difficulty walking up stairs      Subjective Assessment - 01/20/16 0808    Subjective My attitude has changed. I am having family problems because of my pain.    Currently in Pain? Yes   Pain Score 5    Pain Location Knee   Pain Orientation Left            [facility] PT Assessment - 01/20/16 0816    Observation/Other Assessments   Focus on Therapeutic Outcomes (FOTO)  74% limited iimproved from 77%   AROM   Left Knee Extension -5   Left Knee Flexion 82                     [facility] Adult PT Treatment/Exercise - 01/20/16 0001    Ambulation/Gait   Stairs Yes   Stairs Assistance 6: Modified independent (Device/Increase time)   Stair Management Technique Step to pattern   Number of Stairs 10   Height of Stairs 6   Gait Comments Pt performs step to gait on stairs leading with unaffected side, she also descends stairs leading with the unaffected side   Knee/Hip Exercises: Standing   Forward Step Up Step Height: 4";Hand Hold: 1;1 set   Knee/Hip Exercises: Supine   Heel Slides AROM;10 reps   Heel  Slides Limitations 82 degrees   Straight Leg Raises Strengthening;Left;10 reps;1 set   Ultrasound   Ultrasound Location patella tendon and quad tendon   Ultrasound Parameters 100% @ quad tendon, 20% at patella tendon 4 minutes each    Ultrasound Goals Pain  flexibility                  PT Short Term Goals - 01/18/16 0929    PT SHORT TERM GOAL #1   Title "Independent with initial HEP 01-16-16   Status Achieved   PT SHORT TERM GOAL #2   Title "Report pain decrease at rest from  7 /10 to  3 /10 ( 01-16-16)   Status On-going   PT SHORT TERM GOAL #3   Title "Demonstrate and verbalize understanding of condition management including RICE, positioning, use of A.D., HEP.  ( 01-16-16   Status Achieved   PT SHORT TERM GOAL #4   Title Pt will be able to ambulate with proper gait using two crutches ( 01-16-16   Status Achieved           PT Long Term Goals - 01/18/16 0929    PT LONG TERM GOAL #1   Title "  Pt will be independent with advanced HEP.    Status On-going   PT LONG TERM GOAL #2   Title "Pain will decrease to 1/10 with all functional activities   Status On-going   PT LONG TERM GOAL #3   Title "Pt will tolerate standing and walking for 2 hours without increased pain in order to return to PLOF   Status On-going   PT LONG TERM GOAL #4   Title "FOTO will improve from  77% liimitation   to  50% limitation   indicating improved functional mobility    Status On-going   PT LONG TERM GOAL #5   Title Pt will be able to bend knee within 10 degrees of left (Left 140, Right is 72) in order to be able to perform transitional movements with greater ease    Status On-going   PT LONG TERM GOAL #6   Title Pt will be able to negotiate steps without exacerbation of pain   Status On-going               Plan - 01/20/16 0819    Clinical Impression Statement Pt presents with frustration about her workers compensation situation. She saw MD yesterday who did not order any further  testing and recommended she continue PT. She is concerned about her swelling and the MD told her he does not see any swelling in her knee. She plans to seek another opinion due to continued pain/ swelling and decreased function. Instucted pt in functional exercises including stair training and step ups. Pt performs step to pattern on stairs and chooses to descend with unaffected leg first.. She is able to complete 4 inch step ups using rails and admits to increased pain after.    PT Next Visit Plan 3 more visits. Fuctional training for remaining visits? Encourage continued stretch/HEP due to loss of ROM.         Problem List There are no active problems to display for this patient.   Dorene Ar , [place]  01/20/2016, 12:32 PM  [facility] [address] [place], [place], [address] Phone: [phone]   Fax:  [phone]  Name: Tara Hartman MRN: [id number] Date of Birth: 09-07-1981    [name], PT 02/27/2016 8:44 AM Phone: [phone] Fax: [phone]    PHYSICAL THERAPY DISCHARGE SUMMARY  Visits from Start of Care: 9  Current functional level related to goals / functional outcomes: See above    Remaining deficits: Pain, ROM, strength, gait    Education / Equipment: ROM, HEP Plan: Patient agrees to discharge.  Patient goals were not met.    [name], PT 02/27/2016 8:45 AM Phone: [phone] Fax: [phone] Patient is being discharged due to not returning since the last visit.  ?????      [name], PT 02/27/2016 8:46 AM Phone: [phone] Fax: [phone]

## 2016-01-25 ENCOUNTER — Ambulatory Visit: Payer: Self-pay | Admitting: Physical Therapy

## 2016-01-27 ENCOUNTER — Ambulatory Visit: Payer: Self-pay | Admitting: Physical Therapy

## 2016-02-01 ENCOUNTER — Ambulatory Visit: Payer: 59 | Attending: Orthopedic Surgery | Admitting: Physical Therapy

## 2016-02-02 DIAGNOSIS — F419 Anxiety disorder, unspecified: Secondary | ICD-10-CM | POA: Diagnosis not present

## 2016-02-02 DIAGNOSIS — M545 Low back pain: Secondary | ICD-10-CM | POA: Diagnosis not present

## 2016-02-02 DIAGNOSIS — M25562 Pain in left knee: Secondary | ICD-10-CM | POA: Diagnosis not present

## 2016-02-03 ENCOUNTER — Ambulatory Visit: Payer: 59 | Admitting: Physical Therapy

## 2016-02-08 ENCOUNTER — Encounter: Payer: Self-pay | Admitting: Physical Therapy

## 2016-02-09 DIAGNOSIS — M5442 Lumbago with sciatica, left side: Secondary | ICD-10-CM | POA: Diagnosis not present

## 2016-02-09 DIAGNOSIS — M25562 Pain in left knee: Secondary | ICD-10-CM | POA: Diagnosis not present

## 2016-02-09 DIAGNOSIS — M545 Low back pain: Secondary | ICD-10-CM | POA: Diagnosis not present

## 2016-02-10 ENCOUNTER — Encounter: Payer: Self-pay | Admitting: Physical Therapy

## 2016-02-14 ENCOUNTER — Other Ambulatory Visit (HOSPITAL_COMMUNITY): Payer: Self-pay | Admitting: Orthopedic Surgery

## 2016-02-14 DIAGNOSIS — M545 Low back pain: Secondary | ICD-10-CM

## 2016-02-16 ENCOUNTER — Telehealth: Payer: Self-pay | Admitting: Radiology

## 2016-03-14 ENCOUNTER — Ambulatory Visit (HOSPITAL_COMMUNITY): Payer: 59

## 2016-04-02 DIAGNOSIS — M25562 Pain in left knee: Secondary | ICD-10-CM | POA: Diagnosis not present

## 2016-04-02 DIAGNOSIS — G90522 Complex regional pain syndrome I of left lower limb: Secondary | ICD-10-CM | POA: Diagnosis not present

## 2016-06-21 DIAGNOSIS — G90522 Complex regional pain syndrome I of left lower limb: Secondary | ICD-10-CM | POA: Diagnosis not present

## 2016-06-21 DIAGNOSIS — M5416 Radiculopathy, lumbar region: Secondary | ICD-10-CM | POA: Diagnosis not present

## 2016-06-21 DIAGNOSIS — M5442 Lumbago with sciatica, left side: Secondary | ICD-10-CM | POA: Diagnosis not present

## 2016-06-21 DIAGNOSIS — M25562 Pain in left knee: Secondary | ICD-10-CM | POA: Diagnosis not present

## 2016-06-21 DIAGNOSIS — G8929 Other chronic pain: Secondary | ICD-10-CM | POA: Diagnosis not present

## 2016-06-21 DIAGNOSIS — G8921 Chronic pain due to trauma: Secondary | ICD-10-CM | POA: Diagnosis not present

## 2016-06-21 MED FILL — GABAPENTIN 300 MG CAPSULE: 300 | 30 days supply | Qty: 180 | Fill #0

## 2016-06-27 DIAGNOSIS — M25562 Pain in left knee: Secondary | ICD-10-CM | POA: Diagnosis not present

## 2016-06-27 DIAGNOSIS — M545 Low back pain: Secondary | ICD-10-CM | POA: Diagnosis not present

## 2016-06-29 DIAGNOSIS — W19XXXA Unspecified fall, initial encounter: Secondary | ICD-10-CM | POA: Diagnosis not present

## 2016-06-29 DIAGNOSIS — M545 Low back pain: Secondary | ICD-10-CM | POA: Diagnosis not present

## 2016-06-29 DIAGNOSIS — M5127 Other intervertebral disc displacement, lumbosacral region: Secondary | ICD-10-CM | POA: Diagnosis not present

## 2016-06-29 DIAGNOSIS — M79605 Pain in left leg: Secondary | ICD-10-CM | POA: Diagnosis not present

## 2016-07-26 DIAGNOSIS — F329 Major depressive disorder, single episode, unspecified: Secondary | ICD-10-CM | POA: Diagnosis not present

## 2016-07-26 DIAGNOSIS — F419 Anxiety disorder, unspecified: Secondary | ICD-10-CM | POA: Diagnosis not present

## 2016-07-30 DIAGNOSIS — M5416 Radiculopathy, lumbar region: Secondary | ICD-10-CM | POA: Diagnosis not present

## 2016-07-30 DIAGNOSIS — M25562 Pain in left knee: Secondary | ICD-10-CM | POA: Diagnosis not present

## 2016-07-30 DIAGNOSIS — Z79891 Long term (current) use of opiate analgesic: Secondary | ICD-10-CM | POA: Diagnosis not present

## 2016-07-30 DIAGNOSIS — G8929 Other chronic pain: Secondary | ICD-10-CM | POA: Diagnosis not present

## 2016-07-30 DIAGNOSIS — G90522 Complex regional pain syndrome I of left lower limb: Secondary | ICD-10-CM | POA: Diagnosis not present

## 2016-08-02 DIAGNOSIS — M5416 Radiculopathy, lumbar region: Secondary | ICD-10-CM | POA: Diagnosis not present

## 2016-08-02 MED FILL — traMADol HCL 50 MG TABS: 50 | 30 days supply | Qty: 60 | Fill #0

## 2016-08-16 DIAGNOSIS — G90522 Complex regional pain syndrome I of left lower limb: Secondary | ICD-10-CM | POA: Diagnosis not present

## 2016-08-16 DIAGNOSIS — G9050 Complex regional pain syndrome I, unspecified: Secondary | ICD-10-CM | POA: Diagnosis not present

## 2016-09-10 DIAGNOSIS — M5416 Radiculopathy, lumbar region: Secondary | ICD-10-CM | POA: Diagnosis not present

## 2016-09-10 DIAGNOSIS — G8921 Chronic pain due to trauma: Secondary | ICD-10-CM | POA: Diagnosis not present

## 2016-10-08 DIAGNOSIS — G90522 Complex regional pain syndrome I of left lower limb: Secondary | ICD-10-CM | POA: Diagnosis not present

## 2016-10-08 DIAGNOSIS — M5416 Radiculopathy, lumbar region: Secondary | ICD-10-CM | POA: Diagnosis not present

## 2016-10-08 DIAGNOSIS — G8921 Chronic pain due to trauma: Secondary | ICD-10-CM | POA: Diagnosis not present

## 2016-12-27 DIAGNOSIS — M5416 Radiculopathy, lumbar region: Secondary | ICD-10-CM | POA: Diagnosis not present

## 2016-12-27 DIAGNOSIS — G90522 Complex regional pain syndrome I of left lower limb: Secondary | ICD-10-CM | POA: Diagnosis not present

## 2016-12-27 DIAGNOSIS — G8921 Chronic pain due to trauma: Secondary | ICD-10-CM | POA: Diagnosis not present

## 2017-01-15 DIAGNOSIS — M5416 Radiculopathy, lumbar region: Secondary | ICD-10-CM | POA: Diagnosis not present

## 2017-01-23 DIAGNOSIS — G90522 Complex regional pain syndrome I of left lower limb: Secondary | ICD-10-CM | POA: Diagnosis not present

## 2021-09-13 ENCOUNTER — Other Ambulatory Visit: Payer: Self-pay | Admitting: Family Medicine

## 2021-09-13 ENCOUNTER — Ambulatory Visit
Admission: RE | Admit: 2021-09-13 | Discharge: 2021-09-13 | Disposition: A | Discharge status: Home / Self Care | Payer: No Typology Code available for payment source | Source: Ambulatory Visit | Attending: Family Medicine | Admitting: Family Medicine

## 2021-09-13 DIAGNOSIS — M25571 Pain in right ankle and joints of right foot: Secondary | ICD-10-CM

## 2021-10-13 IMAGING — CR DG ANKLE COMPLETE 3+V*R*
3 series · 3 of 3 positions shown · non-contrast
Comparison: None.

CLINICAL DATA: Lateral right ankle pain/swelling s/p injury 2 weeks
ago

EXAM:
RIGHT ANKLE - COMPLETE 3+ VIEW

[x ankle ap right]
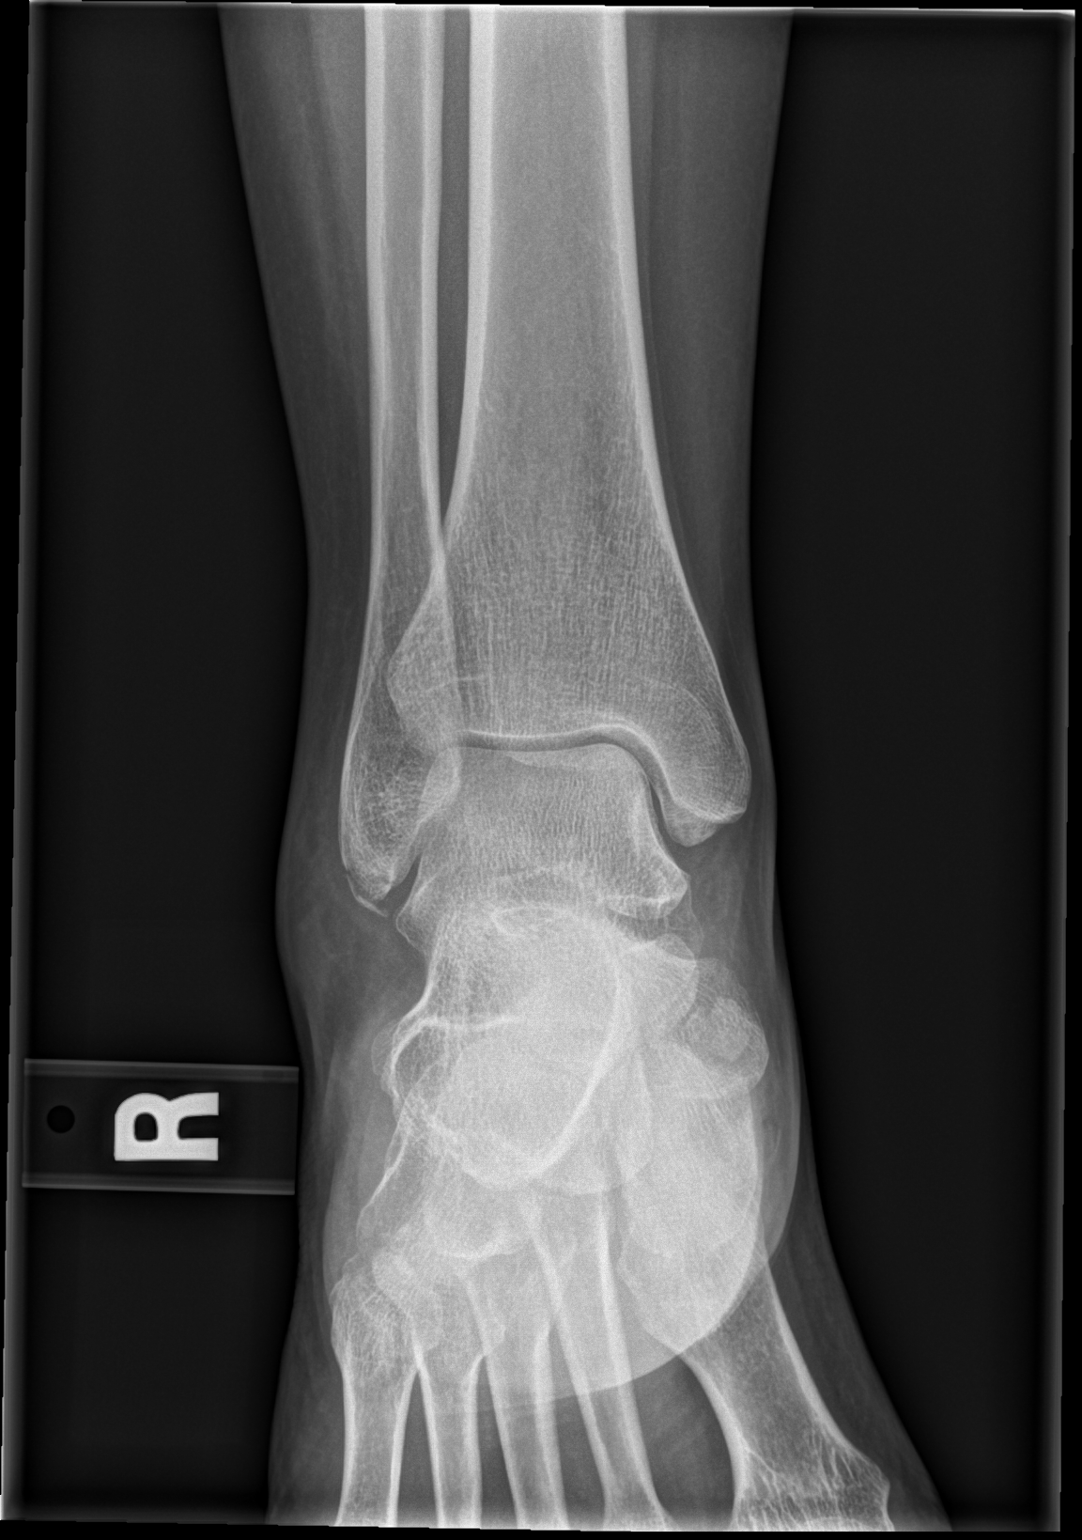

[x ankle obl right]
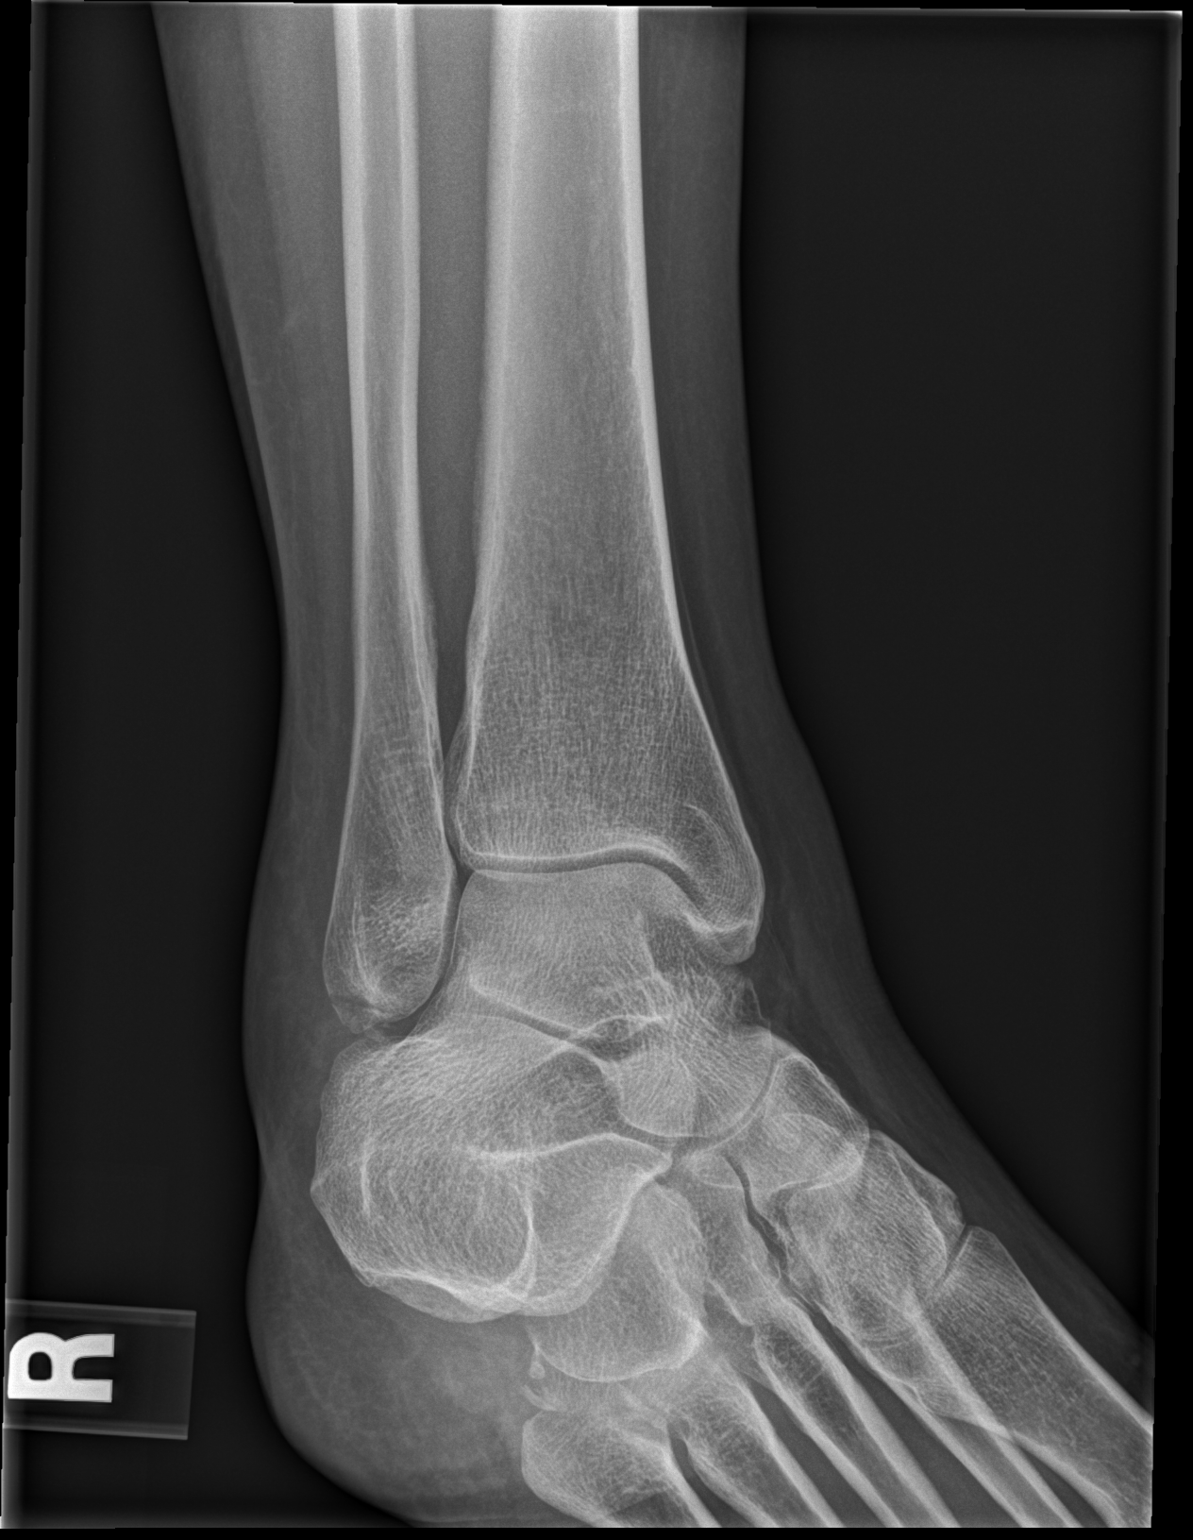

[x ankle lat right]
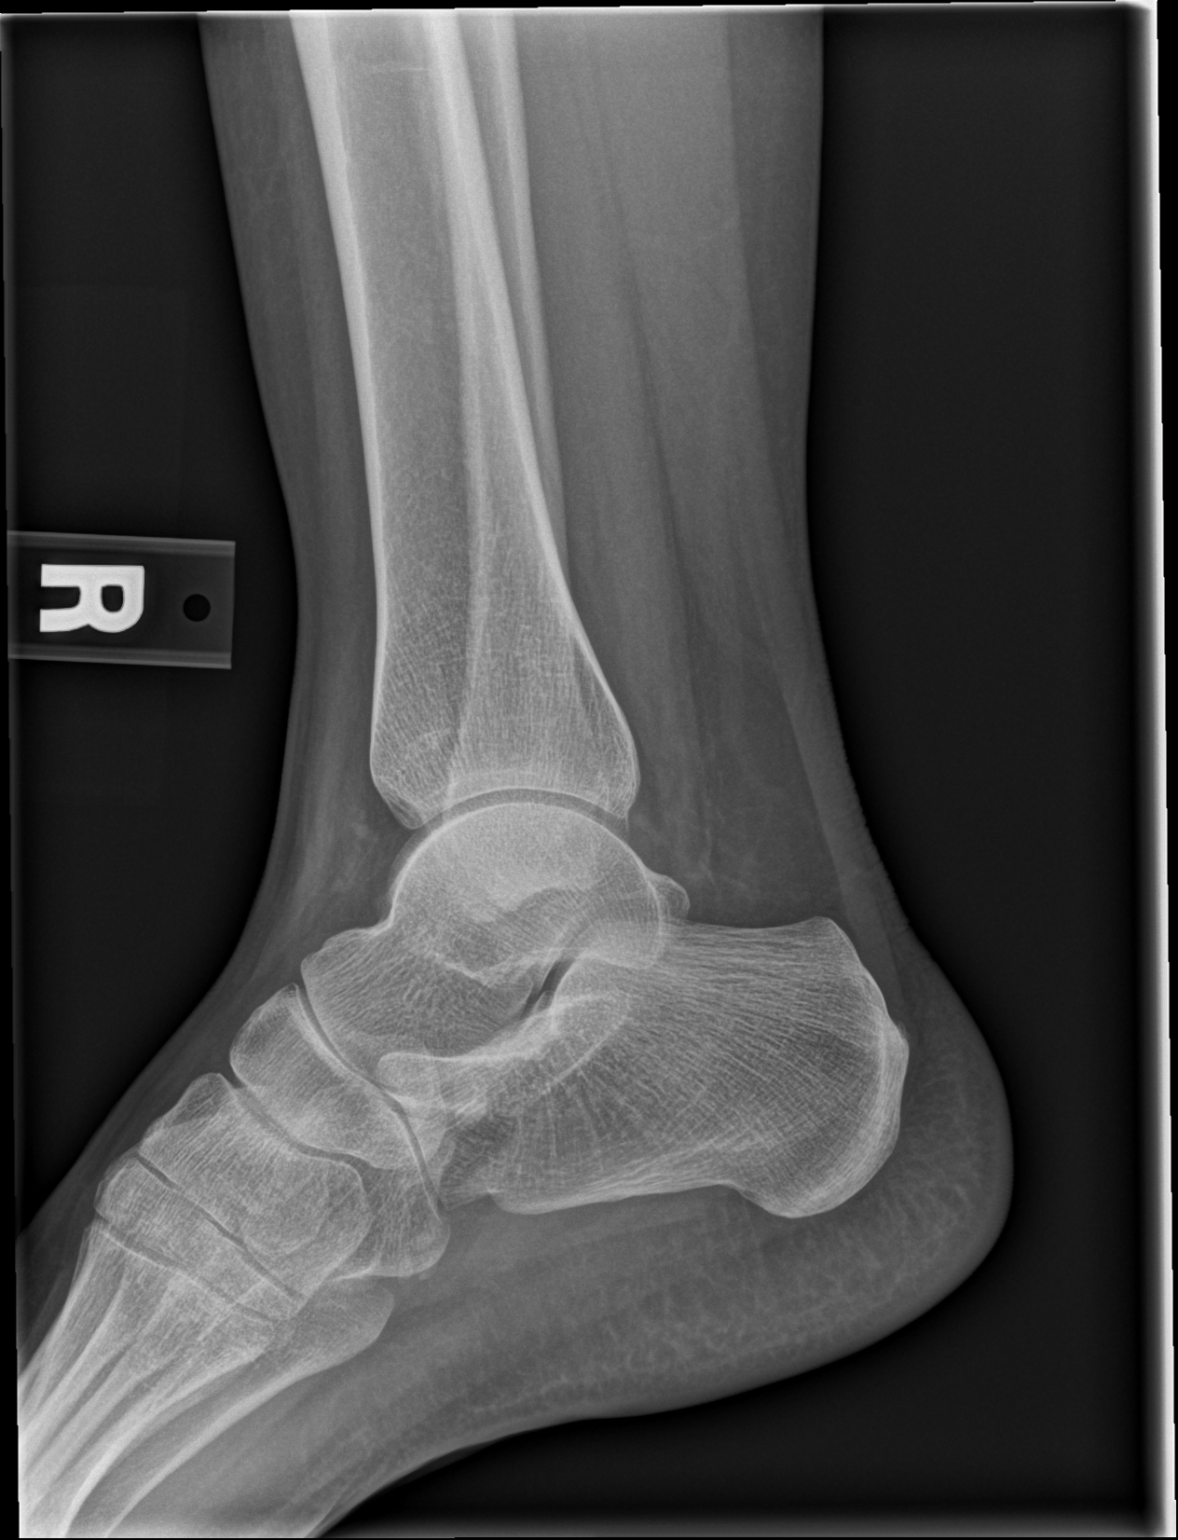

[3 of 3 positions shown; findings below may reference images not displayed]

FINDINGS: There is a small osseous fragment at the tip of the lateral
malleolus concerning for avulsion fracture. The distal tibia and
talar dome are intact. There is lateral ankle soft tissue swelling.
IMPRESSION: Small osseous fragment at the tip of the lateral malleolus
concerning for avulsion fracture.

## 2022-06-27 ENCOUNTER — Encounter (HOSPITAL_BASED_OUTPATIENT_CLINIC_OR_DEPARTMENT_OTHER): Payer: Self-pay

## 2022-06-27 ENCOUNTER — Emergency Department (HOSPITAL_BASED_OUTPATIENT_CLINIC_OR_DEPARTMENT_OTHER)
Admission: EM | Admit: 2022-06-27 | Discharge: 2022-06-27 | Disposition: A | Discharge status: Home / Self Care | Payer: No Typology Code available for payment source | Attending: Emergency Medicine | Admitting: Emergency Medicine

## 2022-06-27 DIAGNOSIS — M5432 Sciatica, left side: Secondary | ICD-10-CM

## 2022-06-27 MED ORDER — KETOROLAC TROMETHAMINE 60 MG/2ML IM SOLN
60.0000 mg | Freq: Once | INTRAMUSCULAR | Status: AC
  Administered 2022-06-27: 60 mg via INTRAMUSCULAR
  Filled 2022-06-27: qty 2

## 2022-06-27 MED ORDER — CYCLOBENZAPRINE HCL 10 MG PO TABS
10.0000 mg | ORAL_TABLET | Freq: Once | ORAL | Status: AC
  Administered 2022-06-27: 10 mg via ORAL
  Filled 2022-06-27: qty 1

## 2022-06-27 MED ORDER — HYDROCODONE-ACETAMINOPHEN 5-325 MG PO TABS: 1.0000 | ORAL_TABLET | Freq: Four times a day (QID) | ORAL | 0 refills | Status: DC | PRN

## 2022-06-27 MED ORDER — OXYCODONE-ACETAMINOPHEN 5-325 MG PO TABS
2.0000 | ORAL_TABLET | Freq: Once | ORAL | Status: AC
  Administered 2022-06-27: 2 via ORAL
  Filled 2022-06-27: qty 2

## 2022-06-27 MED ORDER — CYCLOBENZAPRINE HCL 10 MG PO TABS: 10.0000 mg | ORAL_TABLET | Freq: Two times a day (BID) | ORAL | 0 refills | Status: DC | PRN

## 2022-06-27 NOTE — Discharge Instructions (Addendum)
Try a massage, possibly acupuncture, heating pad.  Short prescription for pain medication and muscle relaxer were sent to your pharmacy.  This is probably all coming from the painting and the recent move.

## 2022-06-27 NOTE — ED Triage Notes (Signed)
Pt presents to the ED with back pain. States that she herniated a disc in her lower back over 7 years ago. Pt reports having back pain ever since. No new injury or trauma. States that she is having 10/10 pain. Denies tingling or numbness any where. No incontinence or decreased sensation. Pt denies pain meds pta. States "I try not to take any pills for my pain"

## 2022-06-27 NOTE — ED Provider Notes (Signed)
[facility] EMERGENCY DEPT Provider Note   CSN: [id number] Arrival date & time: 06/27/22  2053     History  Chief Complaint  Patient presents with   Back Pain    Tara Hartman is a 41 y.o. female.  Patient is a 41 year old female with a history of degenerative disc disease and sciatica that has been present intermittently for the last 7 years.  She reports her back hurts often but usually it is the pain she can tolerate.  However they are moving and several days ago she was doing some painting and helping move and today the pain in her back has been excruciating.  It is in her lower back and radiates down into her left leg.  She denies any incontinence of bowel or urinary symptoms.  She reports she does not take anything for the pain but when it gets this bad sometimes she has to come to the emergency room.  She has not taken anything at home.  No weakness of the leg and patient is able to ambulate.  He reports this feels similar to the prior episodes she has had.  The history is provided by the patient.  Back Pain Location:  Lumbar spine      Home Medications Prior to Admission medications   Medication Sig Start Date End Date Taking? Authorizing Provider  cyclobenzaprine (FLEXERIL) 10 MG tablet Take 1 tablet (10 mg total) by mouth 2 (two) times daily as needed for muscle spasms. 06/27/22  Yes Gwyneth Sprout, MD  HYDROcodone-acetaminophen (NORCO/VICODIN) 5-325 MG tablet Take 1 tablet by mouth every 6 (six) hours as needed for severe pain. 06/27/22  Yes Aras Albarran, Alphonzo Lemmings, MD  albuterol (PROVENTIL HFA;VENTOLIN HFA) 108 (90 BASE) MCG/ACT inhaler Inhale 1 puff into the lungs every 6 (six) hours as needed for wheezing or shortness of breath. 09/04/15   Elson Areas, PA-C  aspirin-acetaminophen-caffeine (EXCEDRIN MIGRAINE) [phone] MG per tablet Take 2 tablets by mouth every 6 (six) hours as needed for headache or migraine.    [provider]  azithromycin  (ZITHROMAX) 250 MG tablet Take 1 tablet (250 mg total) by mouth daily. Take first 2 tablets together, then 1 every day until finished. Patient not taking: Reported on 11/15/2015 09/04/15   Elson Areas, PA-C  ibuprofen (ADVIL,MOTRIN) 600 MG tablet Take 1 tablet (600 mg total) by mouth every 6 (six) hours as needed. Patient not taking: Reported on 11/15/2015 03/12/14   Derwood Kaplan, MD  ibuprofen (ADVIL,MOTRIN) 800 MG tablet Take 1 tablet (800 mg total) by mouth 3 (three) times daily. 01/16/16   Eliseo Squires, PA-C      Allergies    Pork-derived products    Review of Systems   Review of Systems  Musculoskeletal:  Positive for back pain.    Physical Exam Updated Vital Signs BP 140/82 (BP Location: Left Arm)   Pulse 97   Temp 97.9 F (36.6 C) (Oral)   Resp 18   Ht 5\' 6"  (1.676 m)   Wt 74.8 kg   LMP 06/14/2022   SpO2 100%   BMI 26.63 kg/m  Physical Exam Vitals and nursing note reviewed.  Constitutional:      General: She is not in acute distress.    Appearance: She is well-developed.  HENT:     Head: Normocephalic and atraumatic.  Eyes:     Pupils: Pupils are equal, round, and reactive to light.  Cardiovascular:     Rate and Rhythm: Normal rate and regular rhythm.  Pulses: Normal pulses.     Heart sounds: Normal heart sounds. No murmur heard.    No friction rub.  Pulmonary:     Effort: Pulmonary effort is normal.     Breath sounds: Normal breath sounds. No wheezing or rales.  Abdominal:     General: Bowel sounds are normal. There is no distension.     Palpations: Abdomen is soft.     Tenderness: There is no abdominal tenderness. There is no guarding or rebound.  Musculoskeletal:        General: Tenderness present. Normal range of motion.     Cervical back: Normal range of motion and neck supple.     Lumbar back: Spasms and tenderness present.       Back:     Right lower leg: No edema.     Left lower leg: No edema.     Comments: No edema  Skin:     General: Skin is warm and dry.     Findings: No rash.  Neurological:     Mental Status: She is alert and oriented to person, place, and time. Mental status is at baseline.     Cranial Nerves: No cranial nerve deficit.     Sensory: No sensory deficit.     Motor: No weakness.  Psychiatric:        Behavior: Behavior normal.     ED Results / Procedures / Treatments   Labs (all labs ordered are listed, but only abnormal results are displayed) Labs Reviewed - No data to display  EKG None  Radiology No results found.  Procedures Procedures    Medications Ordered in ED Medications  oxyCODONE-acetaminophen (PERCOCET/ROXICET) 5-325 MG per tablet 2 tablet (has no administration in time range)  ketorolac (TORADOL) injection 60 mg (60 mg Intramuscular Given 06/27/22 2247)  cyclobenzaprine (FLEXERIL) tablet 10 mg (10 mg Oral Given 06/27/22 2251)    ED Course/ Medical Decision Making/ A&P                           Medical Decision Making Risk Prescription drug management.   Pt with gradual onset of back pain suggestive of radiculopathy.  No neurovascular compromise and no incontinence.  Pt has no infectious sx, hx of CA  or other red flags concerning for pathologic back pain.  Pt is able to ambulate but is painful.  Normal strength and reflexes on exam.  Denies trauma. Will give pt pain control and to return for developement of above sx.         Final Clinical Impression(s) / ED Diagnoses Final diagnoses:  Sciatica of left side    Rx / DC Orders ED Discharge Orders          Ordered    HYDROcodone-acetaminophen (NORCO/VICODIN) 5-325 MG tablet  Every 6 hours PRN        06/27/22 2321    cyclobenzaprine (FLEXERIL) 10 MG tablet  2 times daily PRN        06/27/22 2321              Gwyneth Sprout, MD 06/27/22 2322

## 2022-06-29 ENCOUNTER — Other Ambulatory Visit: Payer: Self-pay

## 2022-06-29 DIAGNOSIS — Z1231 Encounter for screening mammogram for malignant neoplasm of breast: Secondary | ICD-10-CM

## 2022-07-03 ENCOUNTER — Ambulatory Visit: Payer: No Typology Code available for payment source | Attending: Oncology | Admitting: *Deleted

## 2022-07-03 ENCOUNTER — Ambulatory Visit
Admission: RE | Admit: 2022-07-03 | Discharge: 2022-07-03 | Disposition: A | Discharge status: Home / Self Care | Payer: Self-pay | Source: Ambulatory Visit | Attending: Obstetrics and Gynecology | Admitting: Obstetrics and Gynecology

## 2022-07-03 VITALS — BP 135/94 | Wt 176.5 lb

## 2022-07-03 DIAGNOSIS — Z1231 Encounter for screening mammogram for malignant neoplasm of breast: Secondary | ICD-10-CM | POA: Insufficient documentation

## 2022-07-03 DIAGNOSIS — Z01419 Encounter for gynecological examination (general) (routine) without abnormal findings: Secondary | ICD-10-CM

## 2022-07-03 NOTE — Progress Notes (Signed)
[name] is a 41 y.o. female who presents to [facility] clinic today with no complaints. Patient presents with her husband for clinical breast exam and baseline mammogram.    Pap Smear: Pap not smear completed today. Last Pap smear was in 2022 at [facility] and was normal. Per patient has no history of an abnormal Pap smear. Last Pap smear result is not available in Epic.   Physical exam: Breasts Breasts symmetrical. No skin abnormalities bilateral breasts. No nipple retraction bilateral breasts. No nipple discharge bilateral breasts. No lymphadenopathy. No lumps palpated bilateral breasts.       Pelvic/Bimanual Pap is not indicated today    Smoking History: Patient has is a former smoker    Patient Navigation: Patient education provided. Taught self breast awareness.  Access to services provided for patient through [facility] program. No interpreter provided. No transportation provided   Colorectal Cancer Screening: Per patient has never had colonoscopy completed. No complaints today.  She is not age appropriate for screening.   Breast and Cervical Cancer Risk Assessment: Patient does not have family history of breast cancer, known genetic mutations, or radiation treatment to the chest before age 14. Patient does not have history of cervical dysplasia, immunocompromised, or DES exposure in-utero.  Risk Assessment     Risk Scores       07/03/2022   Last edited by: Narda Rutherford, LPN   5-year risk: 0.4 %   Lifetime risk: 5.7 %            A: BCCCP exam without pap smear  P: Referred patient to the [facility] for a screening mammogram. Appointment scheduled today.  Jim Like, RN 07/03/2022 8:55 AM

## 2022-07-04 ENCOUNTER — Other Ambulatory Visit: Payer: Self-pay | Admitting: Obstetrics and Gynecology

## 2022-07-04 DIAGNOSIS — N6489 Other specified disorders of breast: Secondary | ICD-10-CM

## 2022-07-04 DIAGNOSIS — R928 Other abnormal and inconclusive findings on diagnostic imaging of breast: Secondary | ICD-10-CM

## 2022-07-16 ENCOUNTER — Ambulatory Visit
Admission: RE | Admit: 2022-07-16 | Discharge: 2022-07-16 | Disposition: A | Discharge status: Home / Self Care | Payer: No Typology Code available for payment source | Source: Ambulatory Visit | Attending: Obstetrics and Gynecology | Admitting: Obstetrics and Gynecology

## 2022-07-16 DIAGNOSIS — R928 Other abnormal and inconclusive findings on diagnostic imaging of breast: Secondary | ICD-10-CM

## 2022-07-16 DIAGNOSIS — N6489 Other specified disorders of breast: Secondary | ICD-10-CM | POA: Insufficient documentation

## 2023-03-05 ENCOUNTER — Other Ambulatory Visit: Payer: Self-pay

## 2023-03-05 DIAGNOSIS — R928 Other abnormal and inconclusive findings on diagnostic imaging of breast: Secondary | ICD-10-CM

## 2023-03-05 DIAGNOSIS — N6489 Other specified disorders of breast: Secondary | ICD-10-CM

## 2023-03-12 ENCOUNTER — Telehealth: Payer: Self-pay | Admitting: *Deleted

## 2023-04-25 ENCOUNTER — Other Ambulatory Visit: Payer: Self-pay

## 2023-04-25 DIAGNOSIS — N6489 Other specified disorders of breast: Secondary | ICD-10-CM

## 2023-07-08 ENCOUNTER — Ambulatory Visit
Admission: RE | Admit: 2023-07-08 | Discharge: 2023-07-08 | Disposition: A | Discharge status: Home / Self Care | Payer: No Typology Code available for payment source | Source: Ambulatory Visit | Attending: Obstetrics and Gynecology | Admitting: Obstetrics and Gynecology

## 2023-07-08 ENCOUNTER — Ambulatory Visit
Payer: No Typology Code available for payment source | Attending: Hematology and Oncology | Admitting: Hematology and Oncology

## 2023-07-08 VITALS — BP 131/87 | Wt 171.0 lb

## 2023-07-08 DIAGNOSIS — N6489 Other specified disorders of breast: Secondary | ICD-10-CM

## 2023-07-08 DIAGNOSIS — R928 Other abnormal and inconclusive findings on diagnostic imaging of breast: Secondary | ICD-10-CM | POA: Insufficient documentation

## 2023-07-08 MED ORDER — CEPHALEXIN 500 MG PO CAPS: 500.0000 mg | ORAL_CAPSULE | Freq: Two times a day (BID) | ORAL | 0 refills | Status: DC

## 2023-07-08 NOTE — Patient Instructions (Signed)
Taught Tara Hartman about self breast awareness and gave educational materials to take home. Tara Hartman did not need a Pap smear today due to last Pap smear was in 09/20/2021 per Tara Hartman. Let her know BCCCP will cover Pap smears every 5 years unless has a history of abnormal Pap smears. Referred Tara Hartman to the [facility] for diagnostic mammogram. Appointment scheduled for 07/08/2023. Tara Hartman aware of appointment and will be there. Let Tara Hartman know will follow up with her within the next couple weeks with results. Allyne Gee Creson verbalized understanding.  Pascal Lux, NP 12:28 PM

## 2023-07-08 NOTE — Progress Notes (Signed)
[name] is a 42 y.o. female who presents to [facility] clinic today with no complaints. Follow up possible left breast asymmetry.   Pap Smear: Pap not smear completed today. Last Pap smear was 09/20/2021 at [facility] clinic and was normal. Per patient has no history of an abnormal Pap smear. Last Pap smear result is available in Epic.   Physical exam: Breasts Breasts symmetrical. No skin abnormalities bilateral breasts. No nipple retraction bilateral breasts. No nipple discharge bilateral breasts. No lymphadenopathy. No lumps palpated bilateral breasts. MS DIGITAL DIAG TOMO UNI LEFT  Result Date: 07/16/2022 CLINICAL DATA:  42 year old female recalled from baseline screening mammogram dated 07/03/2022 for a possible left breast asymmetry. EXAM: DIGITAL DIAGNOSTIC UNILATERAL LEFT MAMMOGRAM WITH TOMOSYNTHESIS; ULTRASOUND LEFT BREAST LIMITED TECHNIQUE: Left digital diagnostic mammography and breast tomosynthesis was performed.; Targeted ultrasound examination of the left breast was performed. COMPARISON:  Previous exam(s). ACR Breast Density Category b: There are scattered areas of fibroglandular density. FINDINGS: Previously described, possible asymmetry in the far posterior left breast on the MLO projection partially effaces on today's additional views. A circumscribed masslike asymmetry is os is seen slightly more anteriorly on the ML projection. Further evaluation with ultrasound was performed. Targeted ultrasound is performed, showing an oval, circumscribed nearly anechoic cyst at the 2 o'clock position 7 cm from the nipple. It measures 5 x 5 x 2 mm. This likely corresponds with the masslike asymmetry at mid depth. No additional suspicious findings in the upper outer left breast sonographically. IMPRESSION: 1. Probably benign left breast asymmetry without ultrasound correlate in the upper left breast posteriorly. Recommend short-term imaging follow-up. 2. Probably benign masslike asymmetry likely  corresponding with an anechoic cyst. Recommend short-term imaging follow-up. RECOMMENDATION: Diagnostic left breast mammogram and possible ultrasound in 6 months. I have discussed the findings and recommendations with the patient. If applicable, a reminder letter will be sent to the patient regarding the next appointment. BI-RADS CATEGORY  3: Probably benign. Electronically Signed   By: Sande Brothers M.D.   On: 07/16/2022 11:30  MS DIGITAL SCREENING TOMO BILATERAL  Result Date: 07/04/2022 CLINICAL DATA:  Screening. EXAM: DIGITAL SCREENING BILATERAL MAMMOGRAM WITH TOMOSYNTHESIS AND CAD TECHNIQUE: Bilateral screening digital craniocaudal and mediolateral oblique mammograms were obtained. Bilateral screening digital breast tomosynthesis was performed. The images were evaluated with computer-aided detection. COMPARISON:  None. ACR Breast Density Category b: There are scattered areas of fibroglandular density. FINDINGS: In the left breast, a possible asymmetry warrants further evaluation. In the right breast, no findings suspicious for malignancy. IMPRESSION: Further evaluation is suggested for possible asymmetry in the left breast. RECOMMENDATION: Diagnostic mammogram and possibly ultrasound of the left breast. (Code:[id number]-50M) The patient will be contacted regarding the findings, and additional imaging will be scheduled. BI-RADS CATEGORY  0: Incomplete. Need additional imaging evaluation and/or prior mammograms for comparison. Electronically Signed   By: Sherian Rein M.D.   On: 07/04/2022 13:08          Pelvic/Bimanual Pap is not indicated today    Smoking History: Patient has is a former smoker having quit 5 years ago and was not referred to quit line.    Patient Navigation: Patient education provided. Access to services provided for patient through [facility] program. No interpreter provided. No transportation provided   Colorectal Cancer Screening: Per patient has never had colonoscopy completed No  complaints today.    Breast and Cervical Cancer Risk Assessment: Patient does not have family history of breast cancer, known genetic mutations, or radiation treatment to  the chest before age 9. Patient does not have history of cervical dysplasia, immunocompromised, or DES exposure in-utero.  Risk Assessment   No risk assessment data for the current encounter  Risk Scores       07/03/2022   Last edited by: Tara Rutherford, LPN   5-year risk: 0.4%   Lifetime risk: 5.7%            A: BCCCP exam without pap smear No complaints with benign exam. Small area of redness with complaint of itching under left breast and spreading to the left abdomen. Will send in antibiotic.   P: Referred patient to the [facility] for a diagnostic mammogram. Appointment scheduled 07/08/23.  Pascal Lux, NP 07/08/2023 12:30 PM

## 2023-07-11 ENCOUNTER — Other Ambulatory Visit: Payer: Self-pay | Admitting: Obstetrics and Gynecology

## 2023-07-11 DIAGNOSIS — R928 Other abnormal and inconclusive findings on diagnostic imaging of breast: Secondary | ICD-10-CM

## 2023-11-06 ENCOUNTER — Other Ambulatory Visit
Admission: RE | Admit: 2023-11-06 | Discharge: 2023-11-06 | Disposition: A | Discharge status: Home / Self Care | Payer: Self-pay | Source: Ambulatory Visit | Attending: Family Medicine | Admitting: Family Medicine

## 2023-11-06 DIAGNOSIS — R051 Acute cough: Secondary | ICD-10-CM | POA: Diagnosis not present

## 2023-11-06 DIAGNOSIS — Z1151 Encounter for screening for human papillomavirus (HPV): Secondary | ICD-10-CM | POA: Diagnosis not present

## 2023-11-06 DIAGNOSIS — N939 Abnormal uterine and vaginal bleeding, unspecified: Secondary | ICD-10-CM | POA: Diagnosis not present

## 2023-11-06 DIAGNOSIS — R0609 Other forms of dyspnea: Secondary | ICD-10-CM | POA: Diagnosis not present

## 2023-11-06 DIAGNOSIS — Z03818 Encounter for observation for suspected exposure to other biological agents ruled out: Secondary | ICD-10-CM | POA: Diagnosis not present

## 2023-11-06 DIAGNOSIS — M25571 Pain in right ankle and joints of right foot: Secondary | ICD-10-CM | POA: Diagnosis not present

## 2023-11-06 DIAGNOSIS — Z130 Encounter for screening for diseases of the blood and blood-forming organs and certain disorders involving the immune mechanism: Secondary | ICD-10-CM | POA: Diagnosis not present

## 2023-11-06 DIAGNOSIS — Z6826 Body mass index (BMI) 26.0-26.9, adult: Secondary | ICD-10-CM | POA: Diagnosis not present

## 2023-11-06 DIAGNOSIS — Z13220 Encounter for screening for lipoid disorders: Secondary | ICD-10-CM | POA: Diagnosis not present

## 2023-11-06 DIAGNOSIS — Z1321 Encounter for screening for nutritional disorder: Secondary | ICD-10-CM | POA: Diagnosis not present

## 2023-11-06 DIAGNOSIS — Z01419 Encounter for gynecological examination (general) (routine) without abnormal findings: Secondary | ICD-10-CM | POA: Diagnosis not present

## 2023-11-06 DIAGNOSIS — R079 Chest pain, unspecified: Secondary | ICD-10-CM | POA: Insufficient documentation

## 2023-11-06 DIAGNOSIS — M940 Chondrocostal junction syndrome [Tietze]: Secondary | ICD-10-CM | POA: Diagnosis not present

## 2023-11-06 DIAGNOSIS — J4521 Mild intermittent asthma with (acute) exacerbation: Secondary | ICD-10-CM | POA: Diagnosis not present

## 2023-11-06 DIAGNOSIS — Z131 Encounter for screening for diabetes mellitus: Secondary | ICD-10-CM | POA: Diagnosis not present

## 2023-11-06 DIAGNOSIS — N760 Acute vaginitis: Secondary | ICD-10-CM | POA: Diagnosis not present

## 2023-11-06 DIAGNOSIS — Z1329 Encounter for screening for other suspected endocrine disorder: Secondary | ICD-10-CM | POA: Diagnosis not present

## 2023-11-06 DIAGNOSIS — Z8709 Personal history of other diseases of the respiratory system: Secondary | ICD-10-CM | POA: Diagnosis not present

## 2023-11-06 DIAGNOSIS — Z13228 Encounter for screening for other metabolic disorders: Secondary | ICD-10-CM | POA: Diagnosis not present

## 2023-11-06 LAB — D-DIMER, QUANTITATIVE: D-Dimer, Quant: 0.38 ug{FEU}/mL (ref 0.00–0.50)

## 2023-12-02 DIAGNOSIS — S86311A Strain of muscle(s) and tendon(s) of peroneal muscle group at lower leg level, right leg, initial encounter: Secondary | ICD-10-CM | POA: Diagnosis not present

## 2023-12-02 DIAGNOSIS — G8929 Other chronic pain: Secondary | ICD-10-CM | POA: Diagnosis not present

## 2023-12-02 DIAGNOSIS — M25371 Other instability, right ankle: Secondary | ICD-10-CM | POA: Diagnosis not present

## 2023-12-02 DIAGNOSIS — M25571 Pain in right ankle and joints of right foot: Secondary | ICD-10-CM | POA: Diagnosis not present

## 2023-12-02 DIAGNOSIS — S93401A Sprain of unspecified ligament of right ankle, initial encounter: Secondary | ICD-10-CM | POA: Diagnosis not present

## 2023-12-02 DIAGNOSIS — S82831A Other fracture of upper and lower end of right fibula, initial encounter for closed fracture: Secondary | ICD-10-CM | POA: Diagnosis not present

## 2023-12-03 ENCOUNTER — Other Ambulatory Visit: Payer: Self-pay | Admitting: Podiatry

## 2023-12-03 DIAGNOSIS — S93401A Sprain of unspecified ligament of right ankle, initial encounter: Secondary | ICD-10-CM

## 2023-12-09 ENCOUNTER — Encounter: Payer: Self-pay | Admitting: Podiatry

## 2023-12-14 ENCOUNTER — Ambulatory Visit
Admission: RE | Admit: 2023-12-14 | Discharge: 2023-12-14 | Disposition: A | Discharge status: Home / Self Care | Payer: 59 | Source: Ambulatory Visit | Attending: Podiatry | Admitting: Podiatry

## 2023-12-14 DIAGNOSIS — M7989 Other specified soft tissue disorders: Secondary | ICD-10-CM | POA: Diagnosis not present

## 2023-12-14 DIAGNOSIS — M7661 Achilles tendinitis, right leg: Secondary | ICD-10-CM | POA: Diagnosis not present

## 2023-12-14 DIAGNOSIS — M25571 Pain in right ankle and joints of right foot: Secondary | ICD-10-CM | POA: Diagnosis not present

## 2023-12-14 DIAGNOSIS — S93401A Sprain of unspecified ligament of right ankle, initial encounter: Secondary | ICD-10-CM

## 2023-12-14 DIAGNOSIS — M7671 Peroneal tendinitis, right leg: Secondary | ICD-10-CM | POA: Diagnosis not present

## 2024-06-03 ENCOUNTER — Observation Stay
Admission: EM | Admit: 2024-06-03 | Discharge: 2024-06-06 | Disposition: A | Discharge status: Home / Self Care | Attending: General Surgery | Admitting: General Surgery

## 2024-06-03 ENCOUNTER — Emergency Department

## 2024-06-03 ENCOUNTER — Other Ambulatory Visit: Payer: Self-pay

## 2024-06-03 DIAGNOSIS — R079 Chest pain, unspecified: Secondary | ICD-10-CM | POA: Diagnosis present

## 2024-06-03 DIAGNOSIS — Z87891 Personal history of nicotine dependence: Secondary | ICD-10-CM | POA: Insufficient documentation

## 2024-06-03 DIAGNOSIS — Z7982 Long term (current) use of aspirin: Secondary | ICD-10-CM | POA: Diagnosis not present

## 2024-06-03 DIAGNOSIS — Z79899 Other long term (current) drug therapy: Secondary | ICD-10-CM | POA: Diagnosis not present

## 2024-06-03 DIAGNOSIS — K811 Chronic cholecystitis: Principal | ICD-10-CM | POA: Insufficient documentation

## 2024-06-03 DIAGNOSIS — K8050 Calculus of bile duct without cholangitis or cholecystitis without obstruction: Secondary | ICD-10-CM | POA: Diagnosis present

## 2024-06-03 DIAGNOSIS — R7989 Other specified abnormal findings of blood chemistry: Secondary | ICD-10-CM | POA: Insufficient documentation

## 2024-06-03 DIAGNOSIS — K8020 Calculus of gallbladder without cholecystitis without obstruction: Principal | ICD-10-CM

## 2024-06-03 LAB — CBC
HCT: 35 % — ABNORMAL LOW (ref 36.0–46.0)
Hemoglobin: 11.5 g/dL — ABNORMAL LOW (ref 12.0–15.0)
MCH: 24.9 pg — ABNORMAL LOW (ref 26.0–34.0)
MCHC: 32.9 g/dL (ref 30.0–36.0)
MCV: 75.8 fL — ABNORMAL LOW (ref 80.0–100.0)
Platelets: 325 10*3/uL (ref 150–400)
RBC: 4.62 MIL/uL (ref 3.87–5.11)
RDW: 15.8 % — ABNORMAL HIGH (ref 11.5–15.5)
WBC: 11.4 10*3/uL — ABNORMAL HIGH (ref 4.0–10.5)
nRBC: 0 % (ref 0.0–0.2)

## 2024-06-03 LAB — BASIC METABOLIC PANEL WITH GFR
Anion gap: 12 (ref 5–15)
BUN: 11 mg/dL (ref 6–20)
CO2: 21 mmol/L — ABNORMAL LOW (ref 22–32)
Calcium: 9.3 mg/dL (ref 8.9–10.3)
Chloride: 103 mmol/L (ref 98–111)
Creatinine, Ser: 0.65 mg/dL (ref 0.44–1.00)
GFR, Estimated: >60 mL/min (ref >60)
Glucose, Bld: 179 mg/dL — ABNORMAL HIGH (ref 70–99)
Potassium: 3.4 mmol/L — ABNORMAL LOW (ref 3.5–5.1)
Sodium: 136 mmol/L (ref 135–145)

## 2024-06-03 LAB — TROPONIN I (HIGH SENSITIVITY): Troponin I (High Sensitivity): 11 ng/L (ref <18)

## 2024-06-03 NOTE — ED Triage Notes (Signed)
 Pt reports this morning she developed chest pain and left arm/neck pain, pain is worse with palpation. Pt states she has also had n/v today. Denies known cardiac hx

## 2024-06-04 ENCOUNTER — Observation Stay: Admitting: Certified Registered"

## 2024-06-04 ENCOUNTER — Emergency Department

## 2024-06-04 ENCOUNTER — Other Ambulatory Visit: Payer: Self-pay

## 2024-06-04 ENCOUNTER — Encounter: Payer: Self-pay | Admitting: General Surgery

## 2024-06-04 ENCOUNTER — Encounter
Admission: EM | Disposition: A | Discharge status: Home / Self Care | Payer: Self-pay | Source: Home / Self Care | Attending: Emergency Medicine

## 2024-06-04 DIAGNOSIS — K8020 Calculus of gallbladder without cholecystitis without obstruction: Secondary | ICD-10-CM | POA: Diagnosis not present

## 2024-06-04 DIAGNOSIS — K8050 Calculus of bile duct without cholangitis or cholecystitis without obstruction: Secondary | ICD-10-CM | POA: Diagnosis not present

## 2024-06-04 LAB — HEPATIC FUNCTION PANEL
ALT: 67 U/L — ABNORMAL HIGH (ref 0–44)
AST: 56 U/L — ABNORMAL HIGH (ref 15–41)
Albumin: 3.6 g/dL (ref 3.5–5.0)
Alkaline Phosphatase: 42 U/L (ref 38–126)
Bilirubin, Direct: <0.1 mg/dL (ref 0.0–0.2)
Total Bilirubin: 0.6 mg/dL (ref 0.0–1.2)
Total Protein: 6.7 g/dL (ref 6.5–8.1)

## 2024-06-04 LAB — URINALYSIS, ROUTINE W REFLEX MICROSCOPIC
Bilirubin Urine: NEGATIVE
Glucose, UA: >=500 mg/dL — AB
Hgb urine dipstick: NEGATIVE
Ketones, ur: 5 mg/dL — AB
Leukocytes,Ua: NEGATIVE
Nitrite: NEGATIVE
Protein, ur: NEGATIVE mg/dL
Specific Gravity, Urine: 1.008 (ref 1.005–1.030)
pH: 5 (ref 5.0–8.0)

## 2024-06-04 LAB — LIPASE, BLOOD: Lipase: 41 U/L (ref 11–51)

## 2024-06-04 LAB — TROPONIN I (HIGH SENSITIVITY): Troponin I (High Sensitivity): 10 ng/L (ref <18)

## 2024-06-04 LAB — HIV ANTIBODY (ROUTINE TESTING W REFLEX): HIV Screen 4th Generation wRfx: NONREACTIVE

## 2024-06-04 LAB — POC URINE PREG, ED: Preg Test, Ur: NEGATIVE

## 2024-06-04 SURGERY — CHOLECYSTECTOMY, ROBOT-ASSISTED, LAPAROSCOPIC
Anesthesia: General

## 2024-06-04 MED ORDER — MORPHINE SULFATE (PF) 4 MG/ML IV SOLN
4.0000 mg | Freq: Once | INTRAVENOUS | Status: AC
  Administered 2024-06-04: 4 mg via INTRAVENOUS
  Filled 2024-06-04: qty 1

## 2024-06-04 MED ORDER — ONDANSETRON HCL 4 MG/2ML IJ SOLN
4.0000 mg | Freq: Four times a day (QID) | INTRAMUSCULAR | Status: DC | PRN
  Administered 2024-06-04 (×2): 4 mg via INTRAVENOUS
  Filled 2024-06-04 (×2): qty 2

## 2024-06-04 MED ORDER — FENTANYL CITRATE (PF) 100 MCG/2ML IJ SOLN
INTRAMUSCULAR | Status: AC
  Filled 2024-06-04: qty 2

## 2024-06-04 MED ORDER — ROCURONIUM BROMIDE 10 MG/ML (PF) SYRINGE
PREFILLED_SYRINGE | INTRAVENOUS | Status: AC
  Filled 2024-06-04: qty 40

## 2024-06-04 MED ORDER — LACTATED RINGERS IV SOLN: INTRAVENOUS | Status: AC

## 2024-06-04 MED ORDER — OXYCODONE HCL 5 MG PO TABS: 5.0000 mg | ORAL_TABLET | Freq: Four times a day (QID) | ORAL | 0 refills | Status: DC | PRN

## 2024-06-04 MED ORDER — CHLORHEXIDINE GLUCONATE 0.12 % MT SOLN
OROMUCOSAL | Status: AC
  Filled 2024-06-04: qty 15

## 2024-06-04 MED ORDER — PHENYLEPHRINE 80 MCG/ML (10ML) SYRINGE FOR IV PUSH (FOR BLOOD PRESSURE SUPPORT)
PREFILLED_SYRINGE | INTRAVENOUS | Status: DC | PRN
  Administered 2024-06-04 (×2): 80 ug via INTRAVENOUS

## 2024-06-04 MED ORDER — MIDAZOLAM HCL 2 MG/2ML IJ SOLN
INTRAMUSCULAR | Status: DC | PRN
  Administered 2024-06-04: 2 mg via INTRAVENOUS

## 2024-06-04 MED ORDER — ALBUTEROL SULFATE HFA 108 (90 BASE) MCG/ACT IN AERS
INHALATION_SPRAY | RESPIRATORY_TRACT | Status: AC
  Filled 2024-06-04: qty 6.7

## 2024-06-04 MED ORDER — ONDANSETRON HCL 4 MG/2ML IJ SOLN
4.0000 mg | INTRAMUSCULAR | Status: AC
  Administered 2024-06-04: 4 mg via INTRAVENOUS
  Filled 2024-06-04: qty 2

## 2024-06-04 MED ORDER — LIDOCAINE HCL (PF) 2 % IJ SOLN
INTRAMUSCULAR | Status: AC
  Filled 2024-06-04: qty 15

## 2024-06-04 MED ORDER — FENTANYL CITRATE (PF) 100 MCG/2ML IJ SOLN: 25.0000 ug | INTRAMUSCULAR | Status: DC | PRN

## 2024-06-04 MED ORDER — ONDANSETRON 4 MG PO TBDP
4.0000 mg | ORAL_TABLET | Freq: Four times a day (QID) | ORAL | Status: DC | PRN
  Administered 2024-06-05: 4 mg via ORAL
  Filled 2024-06-04: qty 1

## 2024-06-04 MED ORDER — SUCCINYLCHOLINE CHLORIDE 200 MG/10ML IV SOSY
PREFILLED_SYRINGE | INTRAVENOUS | Status: DC | PRN
  Administered 2024-06-04: 120 mg via INTRAVENOUS

## 2024-06-04 MED ORDER — SIMETHICONE 80 MG PO CHEW
80.0000 mg | CHEWABLE_TABLET | Freq: Four times a day (QID) | ORAL | Status: DC | PRN
  Administered 2024-06-04 – 2024-06-05 (×3): 80 mg via ORAL
  Filled 2024-06-04 (×3): qty 1

## 2024-06-04 MED ORDER — LIDOCAINE HCL (CARDIAC) PF 100 MG/5ML IV SOSY
PREFILLED_SYRINGE | INTRAVENOUS | Status: DC | PRN
  Administered 2024-06-04: 80 mg via INTRAVENOUS

## 2024-06-04 MED ORDER — OXYCODONE HCL 5 MG/5ML PO SOLN: 5.0000 mg | Freq: Once | ORAL | Status: DC | PRN

## 2024-06-04 MED ORDER — 0.9 % SODIUM CHLORIDE (POUR BTL) OPTIME
TOPICAL | Status: DC | PRN
  Administered 2024-06-04: 500 mL

## 2024-06-04 MED ORDER — ROCURONIUM BROMIDE 100 MG/10ML IV SOLN
INTRAVENOUS | Status: DC | PRN
  Administered 2024-06-04: 50 mg via INTRAVENOUS

## 2024-06-04 MED ORDER — PIPERACILLIN-TAZOBACTAM 3.375 G IVPB
3.3750 g | Freq: Three times a day (TID) | INTRAVENOUS | Status: DC
  Administered 2024-06-04 – 2024-06-05 (×4): 3.375 g via INTRAVENOUS
  Filled 2024-06-04 (×3): qty 50

## 2024-06-04 MED ORDER — LACTATED RINGERS IV BOLUS
1000.0000 mL | Freq: Once | INTRAVENOUS | Status: AC
  Administered 2024-06-04: 1000 mL via INTRAVENOUS

## 2024-06-04 MED ORDER — MORPHINE SULFATE (PF) 2 MG/ML IV SOLN
2.0000 mg | INTRAVENOUS | Status: DC | PRN
  Administered 2024-06-04 (×2): 2 mg via INTRAVENOUS
  Filled 2024-06-04 (×2): qty 1

## 2024-06-04 MED ORDER — KETAMINE HCL 50 MG/5ML IJ SOSY
PREFILLED_SYRINGE | INTRAMUSCULAR | Status: DC | PRN
Start: 2024-06-04 — End: 2024-06-04
  Administered 2024-06-04: 20 mg via INTRAVENOUS

## 2024-06-04 MED ORDER — ONDANSETRON HCL 4 MG/2ML IJ SOLN: 4.0000 mg | Freq: Once | INTRAMUSCULAR | Status: DC | PRN

## 2024-06-04 MED ORDER — OXYCODONE HCL 5 MG PO TABS
5.0000 mg | ORAL_TABLET | ORAL | Status: DC | PRN
  Administered 2024-06-04: 10 mg via ORAL
  Administered 2024-06-04 – 2024-06-05 (×4): 5 mg via ORAL
  Filled 2024-06-04 (×3): qty 1
  Filled 2024-06-04: qty 2
  Filled 2024-06-04: qty 1

## 2024-06-04 MED ORDER — KETOROLAC TROMETHAMINE 30 MG/ML IJ SOLN
15.0000 mg | Freq: Once | INTRAMUSCULAR | Status: AC
  Administered 2024-06-04: 15 mg via INTRAVENOUS
  Filled 2024-06-04: qty 1

## 2024-06-04 MED ORDER — FENTANYL CITRATE (PF) 100 MCG/2ML IJ SOLN
INTRAMUSCULAR | Status: DC | PRN
  Administered 2024-06-04 (×2): 50 ug via INTRAVENOUS

## 2024-06-04 MED ORDER — OXYCODONE HCL 5 MG PO TABS: 5.0000 mg | ORAL_TABLET | Freq: Once | ORAL | Status: DC | PRN

## 2024-06-04 MED ORDER — SUCCINYLCHOLINE CHLORIDE 200 MG/10ML IV SOSY
PREFILLED_SYRINGE | INTRAVENOUS | Status: AC
  Filled 2024-06-04: qty 30

## 2024-06-04 MED ORDER — DEXAMETHASONE SODIUM PHOSPHATE 10 MG/ML IJ SOLN
INTRAMUSCULAR | Status: DC | PRN
  Administered 2024-06-04: 10 mg via INTRAVENOUS

## 2024-06-04 MED ORDER — KETAMINE HCL 50 MG/5ML IJ SOSY
PREFILLED_SYRINGE | INTRAMUSCULAR | Status: AC
  Filled 2024-06-04: qty 5

## 2024-06-04 MED ORDER — LACTATED RINGERS IV SOLN: INTRAVENOUS | Status: DC | PRN

## 2024-06-04 MED ORDER — ACETAMINOPHEN 500 MG PO TABS
1000.0000 mg | ORAL_TABLET | Freq: Four times a day (QID) | ORAL | Status: DC
  Administered 2024-06-04 – 2024-06-06 (×9): 1000 mg via ORAL
  Filled 2024-06-04 (×9): qty 2

## 2024-06-04 MED ORDER — KETOROLAC TROMETHAMINE 30 MG/ML IJ SOLN
INTRAMUSCULAR | Status: DC | PRN
  Administered 2024-06-04: 30 mg via INTRAVENOUS

## 2024-06-04 MED ORDER — BUPIVACAINE-EPINEPHRINE (PF) 0.5% -1:200000 IJ SOLN
INTRAMUSCULAR | Status: DC | PRN
  Administered 2024-06-04: 20 mL

## 2024-06-04 MED ORDER — PIPERACILLIN-TAZOBACTAM 3.375 G IVPB
INTRAVENOUS | Status: AC
  Filled 2024-06-04: qty 50

## 2024-06-04 MED ORDER — KETOROLAC TROMETHAMINE 30 MG/ML IJ SOLN
INTRAMUSCULAR | Status: AC
  Filled 2024-06-04: qty 1

## 2024-06-04 MED ORDER — PROPOFOL 10 MG/ML IV BOLUS
INTRAVENOUS | Status: AC
  Filled 2024-06-04: qty 20

## 2024-06-04 MED ORDER — MIDAZOLAM HCL 2 MG/2ML IJ SOLN
INTRAMUSCULAR | Status: AC
Start: 2024-06-04 — End: 2024-06-04
  Filled 2024-06-04: qty 2

## 2024-06-04 MED ORDER — PROPOFOL 10 MG/ML IV BOLUS
INTRAVENOUS | Status: DC | PRN
  Administered 2024-06-04: 180 mg via INTRAVENOUS

## 2024-06-04 MED ORDER — INDOCYANINE GREEN 25 MG IV SOLR
1.2500 mg | INTRAVENOUS | Status: AC
  Administered 2024-06-04: 1.25 mg via INTRAVENOUS

## 2024-06-04 MED ORDER — ONDANSETRON HCL 4 MG/2ML IJ SOLN
INTRAMUSCULAR | Status: DC | PRN
  Administered 2024-06-04: 4 mg via INTRAVENOUS

## 2024-06-04 MED ORDER — BUPIVACAINE LIPOSOME 1.3 % IJ SUSP
INTRAMUSCULAR | Status: AC
  Filled 2024-06-04: qty 10

## 2024-06-04 SURGICAL SUPPLY — 37 items
BAG PRESSURE INF REUSE 1000 (BAG) IMPLANT
CANNULA REDUCER 12-8 DVNC XI (CANNULA) ×1 IMPLANT
CAUTERY HOOK MNPLR 1.6 DVNC XI (INSTRUMENTS) ×1 IMPLANT
CLIP LIGATING HEMO O LOK GREEN (MISCELLANEOUS) ×1 IMPLANT
DERMABOND ADVANCED .7 DNX12 (GAUZE/BANDAGES/DRESSINGS) ×1 IMPLANT
DRAPE ARM DVNC X/XI (DISPOSABLE) ×4 IMPLANT
DRAPE COLUMN DVNC XI (DISPOSABLE) ×1 IMPLANT
ELECTRODE REM PT RTRN 9FT ADLT (ELECTROSURGICAL) ×1 IMPLANT
FORCEPS BPLR 8 MD DVNC XI (FORCEP) IMPLANT
FORCEPS BPLR R/ABLATION 8 DVNC (INSTRUMENTS) ×1 IMPLANT
FORCEPS PROGRASP DVNC XI (FORCEP) ×1 IMPLANT
GLOVE BIOGEL PI IND STRL 7.5 (GLOVE) ×2 IMPLANT
GLOVE SURG SYN 7.0 (GLOVE) ×2 IMPLANT
GLOVE SURG SYN 7.0 PF PI (GLOVE) ×2 IMPLANT
GOWN STRL REUS W/ TWL LRG LVL3 (GOWN DISPOSABLE) ×3 IMPLANT
GRASPER SUT TROCAR 14GX15 (MISCELLANEOUS) ×1 IMPLANT
IRRIGATOR SUCT 8 DISP DVNC XI (IRRIGATION / IRRIGATOR) IMPLANT
IV NS 1000ML BAXH (IV SOLUTION) IMPLANT
KIT PINK PAD W/HEAD ARM REST (MISCELLANEOUS) ×1 IMPLANT
LABEL OR SOLS (LABEL) ×1 IMPLANT
MANIFOLD NEPTUNE II (INSTRUMENTS) ×1 IMPLANT
NDL HYPO 22X1.5 SAFETY MO (MISCELLANEOUS) ×1 IMPLANT
NDL INSUFFLATION 14GA 120MM (NEEDLE) ×1 IMPLANT
NEEDLE HYPO 22X1.5 SAFETY MO (MISCELLANEOUS) ×1 IMPLANT
NEEDLE INSUFFLATION 14GA 120MM (NEEDLE) ×1 IMPLANT
NS IRRIG 500ML POUR BTL (IV SOLUTION) ×1 IMPLANT
OBTURATOR OPTICALSTD 8 DVNC (TROCAR) ×1 IMPLANT
PACK LAP CHOLECYSTECTOMY (MISCELLANEOUS) ×1 IMPLANT
SEAL UNIV 5-12 XI (MISCELLANEOUS) ×4 IMPLANT
SET TUBE SMOKE EVAC HIGH FLOW (TUBING) ×1 IMPLANT
SOLUTION ELECTROSURG ANTI STCK (MISCELLANEOUS) ×1 IMPLANT
SPIKE FLUID TRANSFER (MISCELLANEOUS) ×2 IMPLANT
SPONGE T-LAP 4X18 ~~LOC~~+RFID (SPONGE) IMPLANT
SUT VICRYL 0 UR6 27IN ABS (SUTURE) ×1 IMPLANT
SUTURE MNCRL 4-0 27XMF (SUTURE) ×1 IMPLANT
SYSTEM BAG RETRIEVAL 10MM (BASKET) ×1 IMPLANT
WATER STERILE IRR 500ML POUR (IV SOLUTION) ×1 IMPLANT

## 2024-06-04 NOTE — Op Note (Signed)
 Robotic assisted laparoscopic Cholecystectomy  Pre-operative Diagnosis: Biliary Colic  Post-operative Diagnosis: Same  Procedure:  Robotic assisted laparoscopic Cholecystectomy  Surgeon: Jayson Endow, MD  Anesthesia: Gen. with endotracheal tube  Findings: Mildly distended gallbladder, critical view of safety obtained    Estimated Blood Loss: 10cc       Specimens: Gallbladder           Complications: none   Procedure Details  The patient was seen again in the Holding Room. The benefits, complications, treatment options, and expected outcomes were discussed with the patient. The risks of bleeding, infection, recurrence of symptoms, failure to resolve symptoms, bile duct damage, bile duct leak, retained common bile duct stone, bowel injury, any of which could require further surgery and/or ERCP, stent, or papillotomy were reviewed with the patient. The likelihood of improving the patient's symptoms with return to their baseline status is good.  The patient and/or family concurred with the proposed plan, giving informed consent.  The patient was taken to Operating Room, identified  and the procedure verified as robotic Cholecystectomy.  A Time Out was held and the above information confirmed.  Prior to the induction of general anesthesia, antibiotic prophylaxis was administered. VTE prophylaxis was in place. General endotracheal anesthesia was then administered and tolerated well. After the induction, the abdomen was prepped with Chloraprep and draped in the sterile fashion. The patient was positioned in the supine position.  A veress needle was inserted into the abdomen using standard drop technique. An 8mm infra-umbilical robotic port was then placed under direct visualization. There was no injury noted at the site of veress needle insertion. Two right sided abdominal 8mm ports followed by an 8mm left abdominal robotic ports were placed under direct visualization. The left sided  abdominal port was then upsized to a 12mm robotic port.  The patient was positioned  in reverse Trendelenburg, robot was brought to the surgical field and docked in the standard fashion.  We made sure all the instrumentation was kept indirect view at all times and that there were no collision between the arms. I scrubbed out and went to the console.  The gallbladder was identified, the fundus grasped and retracted cephalad. Adhesions were lysed bluntly. The infundibulum was grasped and retracted laterally, exposing the peritoneum overlying the triangle of Calot. This was then divided and exposed in a blunt fashion. An extended critical view of the cystic duct and cystic artery was obtained.  The cystic duct was clearly identified and bluntly dissected.   Artery and duct were double clipped and divided. Using ICG cholangiography we visualized the cystic duct. The gallbladder was taken from the gallbladder fossa in a retrograde fashion with the electrocautery.  Hemostasis was achieved with the electrocautery. nspection of the right upper quadrant was performed. No bleeding, bile duct injury or leak, or bowel injury was noted. Robotic instruments and robotic arms were undocked in the standard fashion.  I scrubbed back in.  The gallbladder was removed and placed in an Endocatch bag.   The left lower quadrant fascia was then closed with a 0 vicryl using a suture needle passer. The pre-peritoneal space was then infiltrated with liposomal bupivicaine and marcaine solution. Pneumoperitoneum was released.  4-0 subcuticular Monocryl was used to close the skin. Dermabond was  applied.  The patient was then extubated and brought to the recovery room in stable condition. Sponge, lap, and needle counts were correct at closure and at the conclusion of the case.  Jayson Endow, M.D. [facility]

## 2024-06-04 NOTE — TOC CM/SW Note (Signed)
 Transition of Care [facility]) - Inpatient Brief Assessment   Patient Details  Name: Tara Hartman MRN: [id number] Date of Birth: July 02, 1981  Transition of Care [facility]) CM/SW Contact:    Lauraine JAYSON Carpen, LCSW Phone Number: 06/04/2024, 3:34 PM   Clinical Narrative: Patient has orders to discharge home today. Chart reviewed. No TOC needs identified. CSW signing off.  Transition of Care Asessment: Insurance and Status: Insurance coverage has been reviewed Patient has primary care physician: Yes Home environment has been reviewed: Single family home Prior level of function:: Not documented Prior/Current Home Services: No current home services Social Drivers of Health Review: SDOH reviewed no interventions necessary Readmission risk has been reviewed: Yes Transition of care needs: no transition of care needs at this time

## 2024-06-04 NOTE — Transfer of Care (Signed)
 Immediate Anesthesia Transfer of Care Note  Patient: Tara Hartman  Procedure(s) Performed: CHOLECYSTECTOMY, ROBOT-ASSISTED, LAPAROSCOPIC  Patient Location: PACU  Anesthesia Type:General  Level of Consciousness: drowsy  Airway & Oxygen Therapy: Patient Spontanous Breathing and Patient connected to face mask oxygen  Post-op Assessment: Report given to RN  Post vital signs: stable  Last Vitals:  Vitals Value Taken Time  BP 135/76 06/04/24 15:30  Temp    Pulse 83 06/04/24 15:34  Resp 17 06/04/24 15:34  SpO2 100 % 06/04/24 15:34  Vitals shown include unfiled device data.  Last Pain:  Vitals:   06/04/24 1530  TempSrc:   PainSc: Asleep         Complications: No notable events documented.

## 2024-06-04 NOTE — Anesthesia Preprocedure Evaluation (Signed)
 Anesthesia Evaluation  Patient identified by MRN, date of birth, ID band Patient awake    Reviewed: Allergy & Precautions, NPO status , Patient's Chart, lab work & pertinent test results  Airway Mallampati: II  TM Distance: >3 FB Neck ROM: full    Dental  (+) Teeth Intact, Missing   Pulmonary neg pulmonary ROS, Patient abstained from smoking., former smoker   Pulmonary exam normal breath sounds clear to auscultation       Cardiovascular Exercise Tolerance: Good negative cardio ROS Normal cardiovascular exam Rhythm:Regular Rate:Normal     Neuro/Psych negative neurological ROS  negative psych ROS   GI/Hepatic negative GI ROS, Neg liver ROS,,,  Endo/Other  negative endocrine ROS  Class 3 obesity  Renal/GU negative Renal ROS  negative genitourinary   Musculoskeletal   Abdominal  (+) + obese  Peds negative pediatric ROS (+)  Hematology negative hematology ROS (+)   Anesthesia Other Findings History reviewed. No pertinent past medical history.  Past Surgical History: No date: APPENDECTOMY No date: TUBAL LIGATION  BMI    Body Mass Index: 28.96 kg/m      Reproductive/Obstetrics negative OB ROS                              Anesthesia Physical Anesthesia Plan  ASA: 2  Anesthesia Plan: General   Post-op Pain Management:    Induction: Intravenous  PONV Risk Score and Plan: Ondansetron, Dexamethasone , Midazolam and Treatment may vary due to age or medical condition  Airway Management Planned: Oral ETT  Additional Equipment:   Intra-op Plan:   Post-operative Plan: Extubation in OR  Informed Consent: I have reviewed the patients History and Physical, chart, labs and discussed the procedure including the risks, benefits and alternatives for the proposed anesthesia with the patient or authorized representative who has indicated his/her understanding and acceptance.     Dental  Advisory Given  Plan Discussed with: CRNA  Anesthesia Plan Comments:         Anesthesia Quick Evaluation

## 2024-06-04 NOTE — ED Provider Notes (Signed)
 [facility] Provider Note    Event Date/Time   First MD Initiated Contact with Patient 06/04/24 0003     (approximate)   History   Chest Pain   HPI Tara Hartman is a 43 y.o. female who presents for evaluation of pain in her chest and upper abdomen as well as some radiation to the left shoulder.  It is accompanied with nausea and vomiting.  Reportedly it has been going on for a week but it is much worse today and tonight.  She said it is considerably worse after she eats or drinks anything.  She has not felt similar symptoms in the past.  No history of heart disease.  She has a history of asthma and sometimes the pain takes her breath away but otherwise she is not short of breath.  No recent fever.  No lower abdominal pain.     Physical Exam   Triage Vital Signs: ED Triage Vitals [06/03/24 2310]  Encounter Vitals Group     BP (!) 137/93     Girls Systolic BP Percentile      Girls Diastolic BP Percentile      Boys Systolic BP Percentile      Boys Diastolic BP Percentile      Pulse Rate (!) 110     Resp 18     Temp 98.7 F (37.1 C)     Temp src      SpO2 100 %     Weight 77.1 kg (170 lb)     Height 1.676 m (5' 6)     Head Circumference      Peak Flow      Pain Score 9     Pain Loc      Pain Education      Exclude from Growth Chart     Most recent vital signs: Vitals:   06/04/24 0403 06/04/24 0433  BP:  (!) 140/86  Pulse:  90  Resp:  16  Temp: 97.8 F (36.6 C) 97.9 F (36.6 C)  SpO2:  95%    General: Awake, appears uncomfortable and in pain but nontoxic. CV:  Good peripheral perfusion.  Normal heart sounds, regular rhythm. Resp:  Normal effort. Speaking easily and comfortably, no accessory muscle usage nor intercostal retractions.  Lungs are clear to auscultation with no wheezing. Abd:  No distention.  Tenderness to palpation of the epigastrium and right upper quadrant with positive Murphy sign.  No lower abdominal tenderness nor  guarding.   ED Results / Procedures / Treatments   Labs (all labs ordered are listed, but only abnormal results are displayed) Labs Reviewed  BASIC METABOLIC PANEL WITH GFR - Abnormal; Notable for the following components:      Result Value   Potassium 3.4 (*)    CO2 21 (*)    Glucose, Bld 179 (*)    All other components within normal limits  CBC - Abnormal; Notable for the following components:   WBC 11.4 (*)    Hemoglobin 11.5 (*)    HCT 35.0 (*)    MCV 75.8 (*)    MCH 24.9 (*)    RDW 15.8 (*)    All other components within normal limits  URINALYSIS, ROUTINE W REFLEX MICROSCOPIC - Abnormal; Notable for the following components:   Color, Urine STRAW (*)    APPearance CLEAR (*)    Glucose, UA >=500 (*)    Ketones, ur 5 (*)    Bacteria, UA RARE (*)  All other components within normal limits  HEPATIC FUNCTION PANEL - Abnormal; Notable for the following components:   AST 56 (*)    ALT 67 (*)    All other components within normal limits  LIPASE, BLOOD  HIV ANTIBODY (ROUTINE TESTING W REFLEX)  POC URINE PREG, ED  TROPONIN I (HIGH SENSITIVITY)  TROPONIN I (HIGH SENSITIVITY)     EKG  ED ECG REPORT I, Darleene Dome, the attending physician, personally viewed and interpreted this ECG.  Date: 06/03/2024 EKG Time: 23: 07 Rate: 117 Rhythm: Sinus tachycardia QRS Axis: normal Intervals: normal ST/T Wave abnormalities: Non-specific ST segment / T-wave changes, but no clear evidence of acute ischemia. Narrative Interpretation: no definitive evidence of acute ischemia; does not meet STEMI criteria.    RADIOLOGY See ED course for details   PROCEDURES:  Critical Care performed: No  Procedures    IMPRESSION / MDM / ASSESSMENT AND PLAN / ED COURSE  I reviewed the triage vital signs and the nursing notes.                              Differential diagnosis includes, but is not limited to, biliary colic/gallbladder disease, pancreatitis, nonspecific gastritis,  SBO/ileus, much less likely ACS or PE.  Patient's presentation is most consistent with acute presentation with potential threat to life or bodily function.  Labs/studies ordered: High-sensitivity troponin x 2, CBC, urinalysis, BMP, hepatic function panel, lipase, urine pregnancy test, urinalysis, two-view chest x-ray, right upper quadrant ultrasound  Interventions/Medications given:  Medications  lactated ringers infusion ( Intravenous New Bag/Given 06/04/24 0354)  piperacillin-tazobactam (ZOSYN) IVPB 3.375 g (3.375 g Intravenous New Bag/Given 06/04/24 0403)  acetaminophen  (TYLENOL ) tablet 1,000 mg (has no administration in time range)  oxyCODONE  (Oxy IR/ROXICODONE ) immediate release tablet 5-10 mg (has no administration in time range)  morphine (PF) 2 MG/ML injection 2 mg (has no administration in time range)  ondansetron (ZOFRAN-ODT) disintegrating tablet 4 mg (has no administration in time range)    Or  ondansetron (ZOFRAN) injection 4 mg (has no administration in time range)  morphine (PF) 4 MG/ML injection 4 mg (4 mg Intravenous Given 06/04/24 0121)  ondansetron (ZOFRAN) injection 4 mg (4 mg Intravenous Given 06/04/24 0120)  ketorolac  (TORADOL ) 30 MG/ML injection 15 mg (15 mg Intravenous Given 06/04/24 0120)  lactated ringers bolus 1,000 mL (0 mLs Intravenous Stopped 06/04/24 0230)  morphine (PF) 4 MG/ML injection 4 mg (4 mg Intravenous Given 06/04/24 0353)  ondansetron (ZOFRAN) injection 4 mg (4 mg Intravenous Given 06/04/24 0353)    (Note:  hospital course my include additional interventions and/or labs/studies not listed above.)   Normal vital signs.  Patient is uncomfortable but nontoxic in appearance.  Initial high-sensitivity troponin is negative, no ischemia on EKG.  Strongly doubt cardiac etiology and the patient is PERC negative.  Strongly suspect biliary colic.  Added on hepatic function panel and lipase.  Ordered morphine IV and Toradol  IV and Zofran, will proceed with right upper  quadrant ultrasound and reassess.   Clinical Course as of 06/04/24 0527  Thu Jun 04, 2024  0132 Troponin I (High Sensitivity): 10 Normal second troponin [CF]  0155 No lipase but minimally elevated hepatic function tests.  Continuing with ultrasound [CF]  [phone] (Delayed documentation) I independently viewed and interpreted the patient's ultrasound and I can see the gallstones but no evidence of cholecystitis.  I updated the patient and reassessed her.  She is still having pain and  was still moderately to severely tender to palpation of the epigastrium and right upper quadrant, still with positive Murphy sign.  I consulted by phone with Dr. Marinda with general surgery and explained the situation and he agreed to admit the patient for cholecystectomy later today.  I updated the patient who understands and agrees with the plan.  I ordered another dose of morphine 4 mg IV and Zofran 4 mg IV for her ongoing symptoms. [CF]    Clinical Course User Index [CF] Gordan Huxley, MD     FINAL CLINICAL IMPRESSION(S) / ED DIAGNOSES   Final diagnoses:  Symptomatic cholelithiasis  Elevated LFTs     Rx / DC Orders   ED Discharge Orders     None        Note:  This document was prepared using Dragon voice recognition software and may include unintentional dictation errors.   Gordan Huxley, MD 06/04/24 [phone]

## 2024-06-04 NOTE — ED Notes (Signed)
 MD made aware pt continuing to c/o nausea, headache, and chest pain.

## 2024-06-04 NOTE — ED Notes (Signed)
Forbach MD at bedside. 

## 2024-06-04 NOTE — Progress Notes (Deleted)
 Discharge instructions were reviewed with patient. Questions were encouraged and answered. IV x2 were removed. Belongings were collected by patient.

## 2024-06-04 NOTE — Discharge Instructions (Signed)
In addition to included general post-operative instructions,  Diet: Resume home diet. Recommend avoiding or limiting fatty/greasy foods over the next few days/week. If you do eat these, you may (or may not) notice diarrhea. This is expected while your body adjusts to not having a gallbladder, and it typically resolves with time.    Activity: No heavy lifting >20 pounds (children, pets, laundry, garbage) or strenuous activity for 4 weeks, but light activity and walking are encouraged. Do not drive or drink alcohol if taking narcotic pain medications or having pain that might distract from driving.  Wound care: You may shower/get incision wet with soapy water and pat dry (do not rub incisions), but no baths or submerging incision underwater until follow-up.   Medications: Resume all home medications. For mild to moderate pain: acetaminophen (Tylenol) or ibuprofen/naproxen (if no kidney disease). Combining Tylenol with alcohol can substantially increase your risk of causing liver disease. Narcotic pain medications, if prescribed, can be used for severe pain, though may cause nausea, constipation, and drowsiness. Do not combine Tylenol and Percocet (or similar) within a 6 hour period as Percocet (and similar) contain(s) Tylenol. If you do not need the narcotic pain medication, you do not need to fill the prescription.  Call office [phone] / [phone]) at any time if any questions, worsening pain, fevers/chills, bleeding, drainage from incision site, or other concerns.

## 2024-06-04 NOTE — Anesthesia Postprocedure Evaluation (Signed)
 Anesthesia Post Note  Patient: Tara Hartman  Procedure(s) Performed: CHOLECYSTECTOMY, ROBOT-ASSISTED, LAPAROSCOPIC  Patient location during evaluation: PACU Anesthesia Type: General Level of consciousness: awake and alert, oriented and patient cooperative Pain management: pain level controlled Vital Signs Assessment: post-procedure vital signs reviewed and stable Respiratory status: spontaneous breathing, nonlabored ventilation and respiratory function stable Cardiovascular status: blood pressure returned to baseline and stable Postop Assessment: adequate PO intake Anesthetic complications: no   No notable events documented.   Last Vitals:  Vitals:   06/04/24 1615 06/04/24 1640  BP: 122/73 121/88  Pulse: 82 88  Resp: 13 16  Temp: (!) 36.1 C 36.6 C  SpO2: 99% 100%    Last Pain:  Vitals:   06/04/24 1640  TempSrc: Oral  PainSc:                  [name]

## 2024-06-04 NOTE — H&P (Signed)
 [facility] SURGICAL HISTORY & PHYSICAL (cpt [phone])  HISTORY OF PRESENT ILLNESS (HPI):  43 y.o. female presented to [facility] ED overnight for chest pain. Patient reports the acute onset of upper abdominal pain and chest pain yesterday after having breakfast. This radiates to her shoulder as well. This is accompanied by nausea and emesis. She reports a similar history of the same multiple times in the past and typically is post-prandial in nature. No fever, chills, SOB, urinary changes, nor bowel changes. She does have history of prior appendectomy, tubal ligation, c-section, and reportedly abdominoplasty in [place]. Work up in the ED revealed a mild leukocytosis to 11.4K, Hgb to 11.5, sCr - 0.65; hypokalemia to 3.4, bilirubin normal at 0.6, and lipase 41. She did have RUQ US  with cholelithiasis without overt cholecystitis. Unfortunately, her pain was intractable in the ED.   General surgery is consulted by emergency medicine physician Dr Darleene Dome, MD for evaluation and management of acute cholecystitis  PAST MEDICAL HISTORY (PMH):  History reviewed. No pertinent past medical history.  Reviewed. Otherwise negative.   PAST SURGICAL HISTORY (PSH):  Past Surgical History:  Procedure Laterality Date   APPENDECTOMY     TUBAL LIGATION      Reviewed. Otherwise negative.   MEDICATIONS:  Prior to Admission medications   Medication Sig Start Date End Date Taking? Authorizing Provider  albuterol  (PROVENTIL  HFA;VENTOLIN  HFA) 108 (90 BASE) MCG/ACT inhaler Inhale 1 puff into the lungs every 6 (six) hours as needed for wheezing or shortness of breath. 09/04/15   Sofia, Leslie K, PA-C  aspirin-acetaminophen -caffeine (EXCEDRIN MIGRAINE) 250-250-65 MG per tablet Take 2 tablets by mouth every 6 (six) hours as needed for headache or migraine.    [provider]  cephALEXin  (KEFLEX ) 500 MG capsule Take 1 capsule (500 mg total) by mouth 2 (two) times daily. 07/08/23   Harl Setter A, NP   cyclobenzaprine  (FLEXERIL ) 10 MG tablet Take 1 tablet (10 mg total) by mouth 2 (two) times daily as needed for muscle spasms. 06/27/22   Doretha Folks, MD  HYDROcodone -acetaminophen  (NORCO/VICODIN) 5-325 MG tablet Take 1 tablet by mouth every 6 (six) hours as needed for severe pain. 06/27/22   Doretha Folks, MD  ibuprofen  (ADVIL ,MOTRIN ) 800 MG tablet Take 1 tablet (800 mg total) by mouth 3 (three) times daily. 01/16/16   Essie Stephens GRADE, PA-C     ALLERGIES:  Allergies  Allergen Reactions   Pork-Derived Products Itching, Nausea And Vomiting and Rash     SOCIAL HISTORY:  Social History   Socioeconomic History   Marital status: Married    Spouse name: Not on file   Number of children: 3   Years of education: Not on file   Highest education level: Bachelor's degree (e.g., BA, AB, BS)  Occupational History   Not on file  Tobacco Use   Smoking status: Former    Types: Cigarettes   Smokeless tobacco: Never  Vaping Use   Vaping status: Never Used  Substance and Sexual Activity   Alcohol use: Yes    Comment: occ   Drug use: No   Sexual activity: Yes    Birth control/protection: Other-see comments    Comment: Pt thinks she had a tubal ligation.  Other Topics Concern   Not on file  Social History Narrative   Not on file   Social Drivers of Health   Financial Resource Strain: Low Risk  (11/06/2023)   Received from [facility]   Overall Financial Resource Strain (CARDIA)  Difficulty of Paying Living Expenses: Not very hard  Food Insecurity: No Food Insecurity (06/04/2024)   Hunger Vital Sign    Worried About Running Out of Food in the Last Year: Never true    Ran Out of Food in the Last Year: Never true  Transportation Needs: No Transportation Needs (06/04/2024)   PRAPARE - Administrator, Civil Service (Medical): No    Lack of Transportation (Non-Medical): No  Physical Activity: Not on file  Stress: Not on file  Social Connections:  Socially Integrated (06/04/2024)   Social Connection and Isolation Panel    Frequency of Communication with Friends and Family: More than three times a week    Frequency of Social Gatherings with Friends and Family: More than three times a week    Attends Religious Services: More than 4 times per year    Active Member of [employer] or Organizations: Yes    Attends Engineer, structural: More than 4 times per year    Marital Status: Married  Catering manager Violence: Not At Risk (06/04/2024)   Humiliation, Afraid, Rape, and Kick questionnaire    Fear of Current or Ex-Partner: No    Emotionally Abused: No    Physically Abused: No    Sexually Abused: No     FAMILY HISTORY:  Family History  Problem Relation Age of Onset   Diabetes Mother     Otherwise negative.   REVIEW OF SYSTEMS:  Review of Systems  Constitutional:  Negative for chills and fever.  HENT:  Negative for congestion and sore throat.   Respiratory:  Negative for cough and shortness of breath.   Cardiovascular:  Positive for chest pain. Negative for palpitations.  Gastrointestinal:  Positive for abdominal pain, nausea and vomiting. Negative for diarrhea.  Genitourinary:  Negative for dysuria and urgency.  All other systems reviewed and are negative.   VITAL SIGNS:  Temp:  [97.8 F (36.6 C)-98.7 F (37.1 C)] 97.9 F (36.6 C) (07/03 0433) Pulse Rate:  [77-110] 90 (07/03 0433) Resp:  [10-18] 16 (07/03 0433) BP: (129-152)/(69-93) 140/86 (07/03 0433) SpO2:  [93 %-100 %] 95 % (07/03 0433) Weight:  [77.1 kg-81.4 kg] 81.4 kg (07/03 0436)     Height: 5' 6 (167.6 cm) Weight: 81.4 kg BMI (Calculated): 28.98   PHYSICAL EXAM:  Physical Exam Vitals and nursing note reviewed. Exam conducted with a chaperone present.  Constitutional:      General: She is not in acute distress.    Appearance: Normal appearance. She is normal weight. She is not ill-appearing.  HENT:     Head: Normocephalic and atraumatic.  Eyes:      General: No scleral icterus.    Conjunctiva/sclera: Conjunctivae normal.  Cardiovascular:     Rate and Rhythm: Normal rate.     Pulses: Normal pulses.  Pulmonary:     Effort: Pulmonary effort is normal. No respiratory distress.  Abdominal:     General: Abdomen is flat. There is no distension.     Palpations: Abdomen is soft.     Tenderness: There is abdominal tenderness in the right upper quadrant and epigastric area. There is no guarding or rebound.  Genitourinary:    Comments: Deferred Musculoskeletal:     Right lower leg: No edema.     Left lower leg: No edema.  Skin:    General: Skin is warm and dry.     Coloration: Skin is not jaundiced.  Neurological:     General: No focal deficit present.  Mental Status: She is alert and oriented to person, place, and time.  Psychiatric:        Mood and Affect: Mood normal.        Behavior: Behavior normal.     INTAKE/OUTPUT:  This shift: No intake/output data recorded.  Last 2 shifts: @IOLAST2SHIFTS @  Labs:     Latest Ref Rng & Units 06/03/2024   11:12 PM 03/12/2014    1:05 AM 05/23/2013    4:31 PM  CBC  WBC 4.0 - 10.5 K/uL 11.4   9.0   Hemoglobin 12.0 - 15.0 g/dL 88.4  87.0  86.8   Hematocrit 36.0 - 46.0 % 35.0  38.0  41.5   Platelets 150 - 400 K/uL 325         Latest Ref Rng & Units 06/04/2024   12:59 AM 06/03/2024   11:12 PM 03/12/2014    1:05 AM  CMP  Glucose 70 - 99 mg/dL  820  91   BUN 6 - 20 mg/dL  11  16   Creatinine 9.55 - 1.00 mg/dL  9.34  9.19   Sodium 864 - 145 mmol/L  136  142   Potassium 3.5 - 5.1 mmol/L  3.4  3.9   Chloride 98 - 111 mmol/L  103  104   CO2 22 - 32 mmol/L  21    Calcium 8.9 - 10.3 mg/dL  9.3    Total Protein 6.5 - 8.1 g/dL 6.7     Total Bilirubin 0.0 - 1.2 mg/dL 0.6     Alkaline Phos 38 - 126 U/L 42     AST 15 - 41 U/L 56     ALT 0 - 44 U/L 67        Imaging studies:   RUQ US  (06/04/2024) personally reviewed with cholelithiasis but no significant changes to suggest cholecystitis, and  radiologist report reviewed below:  IMPRESSION: 1. Cholelithiasis without evidence of acute cholecystitis. 2. Hepatic steatosis with small hepatic cysts.   Assessment/Plan: (ICD-10's: K81.0) 43 y.o. female with intractable RUQ pain concerning for biliary colic vs cholecystitis.    - We will plan on robotic assisted laparoscopic cholecystectomy this afternoon with Dr Marinda pending OR/Anesthesia availability - All risks, benefits, and alternatives to above procedure(s) were discussed with the patient, all of her questions were answered to her expressed satisfaction, patient expresses she wishes to proceed, and informed consent was obtained.    - NPO for planned procedure - ICG on call to OR - IV Abx (Zosyn)   - Monitor abdominal examination - Pain control prn; antiemetics prn   All of the above findings and recommendations were discussed with the patient, and all of her questions were answered to her expressed satisfaction.  -- Arthea Platt, PA-C [facility] 06/04/2024, 7:17 AM M-F: 7am - 4pm

## 2024-06-04 NOTE — Anesthesia Procedure Notes (Signed)
 Procedure Name: Intubation Date/Time: 06/04/2024 2:22 PM  Performed by: Lennie Lamarr HERO, CRNAPre-anesthesia Checklist: Patient identified, Emergency Drugs available, Suction available and Patient being monitored Patient Re-evaluated:Patient Re-evaluated prior to induction Oxygen Delivery Method: Circle System Utilized Preoxygenation: Pre-oxygenation with 100% oxygen Induction Type: IV induction Ventilation: Mask ventilation without difficulty Laryngoscope Size: McGrath and 3 Grade View: Grade I Tube type: Oral Tube size: 7.5 mm Number of attempts: 1 Airway Equipment and Method: Stylet, Oral airway and Video-laryngoscopy Placement Confirmation: ETT inserted through vocal cords under direct vision, positive ETCO2 and breath sounds checked- equal and bilateral Secured at: 21 cm Tube secured with: Tape Dental Injury: Teeth and Oropharynx as per pre-operative assessment

## 2024-06-04 NOTE — Discharge Summary (Signed)
 Patient ID: Tara Hartman MRN: [id number] DOB/AGE: [birth year] 43 y.o.  Admit date: 06/03/2024 Discharge date: 06/04/2024   Discharge Diagnoses:  Principal Problem:   Biliary colic Active Problems:   Symptomatic cholelithiasis   Procedures:robotic assisted cholecystectomy  Hospital Course:   admitted with findings consistent with biliary colic and  was taken promptly to the operating room for an uneventful laparoscopic robot chole.  Patient was kept overnight.  The time of discharge the patient was ambulating,  pain was controlled.  Her vital signs were stable and she was afebrile.   physical exam at discharge showed a pt  in no acute distress.  Awake and alert.  Abdomen: Soft incisions healing well without infection or peritonitis.  Extremities well-perfused and no edema.  Condition of the patient the time of discharge was stable   Consults: None  Disposition: Discharge disposition: 01-Home or Self Care       Discharge Instructions     Call MD for:  difficulty breathing, headache or visual disturbances   Complete by: As directed    Call MD for:  extreme fatigue   Complete by: As directed    Call MD for:  hives   Complete by: As directed    Call MD for:  persistant dizziness or light-headedness   Complete by: As directed    Call MD for:  persistant nausea and vomiting   Complete by: As directed    Call MD for:  redness, tenderness, or signs of infection (pain, swelling, redness, odor or green/yellow discharge around incision site)   Complete by: As directed    Call MD for:  severe uncontrolled pain   Complete by: As directed    Call MD for:  temperature >100.4   Complete by: As directed    Diet - low sodium heart healthy   Complete by: As directed    Discharge instructions   Complete by: As directed    You have surgical glue over incisions.  This will peel off over the next 2 to 4 weeks.  You may shower on postoperative day 1.  Please at the warm water run over your  incisions and pat dry.  You may place ice over your incisions to help with swelling.  Please do not submerge the wounds in water for 2 weeks.  Please do not lift anything heavier than 10 to 15 pounds for 4 weeks.  You may take Tylenol  and ibuprofen  as needed for pain.  You should follow-up in the office in 2 weeks.   Increase activity slowly   Complete by: As directed       Allergies as of 06/04/2024       Reactions   Pork-derived Products Itching, Nausea And Vomiting, Rash        Medication List     TAKE these medications    oxyCODONE  5 MG immediate release tablet Commonly known as: Oxy IR/ROXICODONE  Take 1 tablet (5 mg total) by mouth every 6 (six) hours as needed for severe pain (pain score 7-10).        Follow-up Information     Marinda Jayson KIDD, MD. Go on 06/18/2024.   Specialty: General Surgery Why: Go to appointment on 07/17 at 1045 AM Contact information: [address] [place] [address] [phone]                  Jayson Marinda, M.D.

## 2024-06-04 NOTE — ED Notes (Addendum)
 Pt presented to ED with c/o headache, L arm pain, n/v, and chest pain starting 1 week ago. Pt states has not taken OTC medications to help with pain. Husband also states pt BP 140s/90s. Pt states does not take any home medications for any reason. Pt attached to cardiac monitor, bed in lowest position, call bell within reach. Pt updated on plan of care and made aware waiting on troponin which is a timed study. Family at bedside.

## 2024-06-04 NOTE — Plan of Care (Signed)

## 2024-06-05 MED ORDER — LACTATED RINGERS IV SOLN: INTRAVENOUS | Status: AC

## 2024-06-05 MED ORDER — KETOROLAC TROMETHAMINE 30 MG/ML IJ SOLN
30.0000 mg | Freq: Four times a day (QID) | INTRAMUSCULAR | Status: DC
  Administered 2024-06-05 – 2024-06-06 (×4): 30 mg via INTRAVENOUS
  Filled 2024-06-05 (×4): qty 1

## 2024-06-05 NOTE — Plan of Care (Signed)

## 2024-06-05 NOTE — Progress Notes (Signed)
 POD # 1 Still w pain, emesis yesterday AVSS  PE NAD Abd: soft but distended and tympanic, incision c/d/I no peritonitis   A/P Developing ileus CLD only Ambulate Decrease narcotics Schedule toradol  DC in am if ok

## 2024-06-06 LAB — COMPREHENSIVE METABOLIC PANEL WITH GFR
ALT: 77 U/L — ABNORMAL HIGH (ref 0–44)
AST: 41 U/L (ref 15–41)
Albumin: 3.1 g/dL — ABNORMAL LOW (ref 3.5–5.0)
Alkaline Phosphatase: 35 U/L — ABNORMAL LOW (ref 38–126)
Anion gap: 10 (ref 5–15)
BUN: 10 mg/dL (ref 6–20)
CO2: 22 mmol/L (ref 22–32)
Calcium: 8.3 mg/dL — ABNORMAL LOW (ref 8.9–10.3)
Chloride: 108 mmol/L (ref 98–111)
Creatinine, Ser: 0.76 mg/dL (ref 0.44–1.00)
GFR, Estimated: >60 mL/min (ref >60)
Glucose, Bld: 105 mg/dL — ABNORMAL HIGH (ref 70–99)
Potassium: 3.2 mmol/L — ABNORMAL LOW (ref 3.5–5.1)
Sodium: 140 mmol/L (ref 135–145)
Total Bilirubin: 0.4 mg/dL (ref 0.0–1.2)
Total Protein: 5.9 g/dL — ABNORMAL LOW (ref 6.5–8.1)

## 2024-06-06 MED ORDER — ONDANSETRON HCL 4 MG PO TABS: 4.0000 mg | ORAL_TABLET | Freq: Three times a day (TID) | ORAL | 0 refills | Status: DC | PRN

## 2024-06-09 LAB — SURGICAL PATHOLOGY

## 2024-06-18 ENCOUNTER — Ambulatory Visit (INDEPENDENT_AMBULATORY_CARE_PROVIDER_SITE_OTHER): Payer: Self-pay | Admitting: General Surgery

## 2024-06-18 ENCOUNTER — Encounter: Payer: Self-pay | Admitting: General Surgery

## 2024-06-18 VITALS — BP 114/77 | HR 75 | Temp 98.3°F | Wt 169.4 lb

## 2024-06-18 DIAGNOSIS — K8050 Calculus of bile duct without cholangitis or cholecystitis without obstruction: Secondary | ICD-10-CM

## 2024-06-18 DIAGNOSIS — Z09 Encounter for follow-up examination after completed treatment for conditions other than malignant neoplasm: Secondary | ICD-10-CM

## 2024-06-18 NOTE — Progress Notes (Signed)
 Patient returns status post robotic assisted cholecystectomy.  She reports doing well.  Her pain is well-controlled.  She is tolerating a diet and having normal bowel function.  Her incisions are healing well without any erythema or drainage.  On exam her abdomen is soft, nontender nondistended.  Surgical glue still over the incision sites.  There is no erythema.  Pathology consistent with chronic cholecystitis.  Recommend he continue lifting restrictions of no greater than 10 to 15 pounds for 2 weeks.  She can now submerge the wounds in water.  I told her the surgical glue will fall off over the next 2 to 4 weeks.  She can follow-up with us  as needed

## 2024-06-18 NOTE — Patient Instructions (Signed)

## 2024-06-26 ENCOUNTER — Telehealth: Payer: Self-pay | Admitting: *Deleted
